# Patient Record
Sex: Male | Born: 1945 | Race: Black or African American | Hispanic: No | Smoking: Current every day smoker
Health system: Southern US, Community
[De-identification: ages and names within clinical notes are randomized; demographics above are authoritative.]

## PROBLEM LIST (undated history)

## (undated) DIAGNOSIS — F0390 Unspecified dementia without behavioral disturbance: Secondary | ICD-10-CM

## (undated) DIAGNOSIS — B1920 Unspecified viral hepatitis C without hepatic coma: Secondary | ICD-10-CM

## (undated) DIAGNOSIS — C259 Malignant neoplasm of pancreas, unspecified: Secondary | ICD-10-CM

## (undated) DIAGNOSIS — I10 Essential (primary) hypertension: Secondary | ICD-10-CM

---

## 2001-01-06 ENCOUNTER — Emergency Department (HOSPITAL_COMMUNITY): Admission: EM | Admit: 2001-01-06 | Discharge: 2001-01-06 | Payer: Self-pay | Admitting: Emergency Medicine

## 2001-05-24 ENCOUNTER — Encounter: Payer: Self-pay | Admitting: Emergency Medicine

## 2001-05-24 ENCOUNTER — Inpatient Hospital Stay (HOSPITAL_COMMUNITY): Admission: EM | Admit: 2001-05-24 | Discharge: 2001-05-25 | Payer: Self-pay | Admitting: Emergency Medicine

## 2003-10-13 ENCOUNTER — Emergency Department (HOSPITAL_COMMUNITY): Admission: EM | Admit: 2003-10-13 | Discharge: 2003-10-13 | Payer: Self-pay | Admitting: Emergency Medicine

## 2004-06-25 ENCOUNTER — Emergency Department (HOSPITAL_COMMUNITY): Admission: EM | Admit: 2004-06-25 | Discharge: 2004-06-25 | Payer: Self-pay | Admitting: Emergency Medicine

## 2004-07-01 ENCOUNTER — Emergency Department (HOSPITAL_COMMUNITY): Admission: EM | Admit: 2004-07-01 | Discharge: 2004-07-01 | Payer: Self-pay | Admitting: Family Medicine

## 2005-01-09 ENCOUNTER — Encounter: Admission: RE | Admit: 2005-01-09 | Discharge: 2005-01-09 | Payer: Self-pay | Admitting: Occupational Medicine

## 2005-01-16 ENCOUNTER — Encounter: Admission: RE | Admit: 2005-01-16 | Discharge: 2005-01-16 | Payer: Self-pay | Admitting: Internal Medicine

## 2008-10-08 ENCOUNTER — Emergency Department (HOSPITAL_BASED_OUTPATIENT_CLINIC_OR_DEPARTMENT_OTHER): Admission: EM | Admit: 2008-10-08 | Discharge: 2008-10-08 | Payer: Self-pay | Admitting: Emergency Medicine

## 2009-05-08 ENCOUNTER — Emergency Department (HOSPITAL_COMMUNITY): Admission: EM | Admit: 2009-05-08 | Discharge: 2009-05-08 | Payer: Self-pay | Admitting: Emergency Medicine

## 2009-05-10 ENCOUNTER — Emergency Department (HOSPITAL_BASED_OUTPATIENT_CLINIC_OR_DEPARTMENT_OTHER): Admission: EM | Admit: 2009-05-10 | Discharge: 2009-05-10 | Payer: Self-pay | Admitting: Emergency Medicine

## 2009-05-14 ENCOUNTER — Emergency Department (HOSPITAL_COMMUNITY): Admission: EM | Admit: 2009-05-14 | Discharge: 2009-05-14 | Payer: Self-pay | Admitting: Emergency Medicine

## 2009-05-14 ENCOUNTER — Ambulatory Visit: Payer: Self-pay | Admitting: Diagnostic Radiology

## 2009-05-14 ENCOUNTER — Emergency Department (HOSPITAL_BASED_OUTPATIENT_CLINIC_OR_DEPARTMENT_OTHER): Admission: EM | Admit: 2009-05-14 | Discharge: 2009-05-14 | Payer: Self-pay | Admitting: Emergency Medicine

## 2011-03-25 LAB — DIFFERENTIAL
Basophils Relative: 1 % (ref 0–1)
Basophils Relative: 1 % (ref 0–1)
Eosinophils Absolute: 0.2 10*3/uL (ref 0.0–0.7)
Lymphocytes Relative: 38 % (ref 12–46)
Monocytes Absolute: 0.4 10*3/uL (ref 0.1–1.0)
Monocytes Relative: 10 % (ref 3–12)
Neutro Abs: 2.1 10*3/uL (ref 1.7–7.7)
Neutrophils Relative %: 37 % — ABNORMAL LOW (ref 43–77)

## 2011-03-25 LAB — RAPID URINE DRUG SCREEN, HOSP PERFORMED
Barbiturates: NOT DETECTED
Opiates: NOT DETECTED
Tetrahydrocannabinol: NOT DETECTED

## 2011-03-25 LAB — BASIC METABOLIC PANEL
BUN: 15 mg/dL (ref 6–23)
CO2: 28 mEq/L (ref 19–32)
CO2: 31 mEq/L (ref 19–32)
Calcium: 8.6 mg/dL (ref 8.4–10.5)
Chloride: 109 mEq/L (ref 96–112)
Chloride: 107 mEq/L (ref 96–112)
Creatinine, Ser: 1 mg/dL (ref 0.4–1.5)
GFR calc Af Amer: >60 mL/min (ref >60)
Potassium: 3.7 mEq/L (ref 3.5–5.1)
Sodium: 142 mEq/L (ref 135–145)

## 2011-03-25 LAB — POCT I-STAT, CHEM 8
BUN: 14 mg/dL (ref 6–23)
Calcium, Ion: 1.14 mmol/L (ref 1.12–1.32)
Chloride: 109 mEq/L (ref 96–112)
Glucose, Bld: 85 mg/dL (ref 70–99)

## 2011-03-25 LAB — CBC
HCT: 38.9 % — ABNORMAL LOW (ref 39.0–52.0)
Hemoglobin: 13.1 g/dL (ref 13.0–17.0)
MCHC: 33.7 g/dL (ref 30.0–36.0)
MCHC: 33.5 g/dL (ref 30.0–36.0)
MCV: 91.2 fL (ref 78.0–100.0)
Platelets: 184 10*3/uL (ref 150–400)
RBC: 4.3 MIL/uL (ref 4.22–5.81)

## 2011-05-02 NOTE — Discharge Summary (Signed)
[place]. [facility]  Patient:    Joseph Robbins, Joseph Robbins                        MRN: [id number] Adm. Date:  [id number] Disc. Date: [id number] Attending:  Anastasio Auerbach CC:         Kern Reap, M.D.   Discharge Summary  DATE OF BIRTH:  1946-06-28.  DISCHARGE DIAGNOSES:  1. Chest pain, etiology unclear.     a. Essentially normal cardiac catheterization on May 25, 2001.     b. Questionable gastroesophageal reflux disease.     c. 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     d. Suggested outpatient gastrointestinal evaluation.  2. Uncontrolled hypertension, history of noncompliance with medical regimen.  3. Chronic lung disease with severe chronic obstructive pulmonary disease,     evidenced by computerized tomography scan.  4. Interstitial lung disease of unknown etiology, outpatient follow-up     recommended.  5. Acute bronchitis:  Complete course of antibiotic therapy.  6. Questionable cardiomegaly by chest x-ray and computerized tomography of     the chest:  No evidence of such on cardiac catheterization with normal     ejection fraction on left ventriculogram.  7. Mildly elevated transaminases, questionable history of previous outpatient     workup, etiology unknown.  8. Tobacco abuse:  Motivated to quit and to use nicotine patches.  9. Normocytic anemia:  Outpatient workup recommended. 10. Allergy to penicillin.  DISCHARGE MEDICATIONS: 1. Lasix 20 mg q.d. 2. Enteric-coated aspirin 325 mg q.d. 3. Humibid LA 600 mg 2 tablets b.i.d. p.r.n. 4. Protonix 40 mg 1 p.o. q.d. 5. Lactulose 15 to 30 mL q.d. p.r.n. constipation. 6. Albuterol inhaler 2 puffs q.i.d. p.r.n. 7. Atrovent inhaler 2 puffs q.i.d. p.r.n. 8. Doxycycline 100 mg 1 tablet b.i.d. for 6 days and then stop.  CONSULTANTS:  [facility].  PROCEDURES: 1. Left sided coronary artery catheterization on May 25, 2001 revealing    normal wall motion with ejection fraction  65-70%.  No significant disease    in the left main with no significant disease in the left anterior    descending coronary artery and an essentially clear nondominant right    coronary artery. 2. CT scan of the chest on May 24, 2001 with spiral CT protocol.  No evidence    of pulmonary embolus.  Small bowel effusions, left greater than right.    Cardiomegaly, significant scarring/early fibrosis with moderate    emphysematous change throughout.  FOLLOW-UP:  The patient wishes to initiate a relationship with a new primary care physician here in [place].  I have facilitated his follow-up with Dr. Kern Reap at [facility] on Monday, June 07, 2001 at 10:15 a.m.  The patient is instructed that it is very important for him to follow up with Dr. Kern Reap.  HISTORY OF PRESENT ILLNESS:  Joseph Robbins is a very pleasant 65 year old African-American male who presents to the hospital on the date of admission with complaints of worsening chronic chest pain.  The patient describes the pain as intermittent, pressure-like, and nonexertional.  The patient stated that he had the pain for many months.  He stated that his pain was normally relieved with Motrin or simply with standing up.  However, this episode was started approximately two days prior to admission, did not relent.  The patients pain was constant, lasting the entire 24 hours prior to his admission.  The patient felt that  his pain was likely due to gas with severe constipation. Because of significant concern for possible coronary artery disease, the patient was admitted to the telemetry unit for ongoing care.  HOSPITAL COURSE: #1 - CHEST PAIN:  The patient was initially treated with IV nitrates, heparin, and cardiology consultation was obtained.  Cardiac enzymes were obtained and elevated CK was appreciated with normal troponin Is.  There was no significant acute change noted in the EKG.  Under cardiology  recommendations and consent of the patient, the patient was taken for a left heart catheterization for risk stratification on May 25, 2001.  As noted above, there was no evidence of significant coronary artery disease and therefore the feeling was the patients episode did not likely represent true angina.  The etiology of the patients elevated CK was unclear but it was felt that simple outpatient follow-up would be adequate given the patients normal catheterization.  There was no evidence of congestive heart failure on left ventriculogram as noted above.  In outpatient follow-up, it is recommended that further investigation be given to possible GI etiology of the patients pain.  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  The patient indeed reported that he had not had a bowel movement in four days prior to his admission.  He is being discharged on lactulose for treatment of this issue.  Given the concomitant findings of normocytic anemia, it would be prudent, however, to obtain outpatient GI evaluation for possible EGD to rule out gastritis or peptic ulcer and routine screening colonoscopy.  #2 - UNCONTROLLED HYPERTENSION:  Treated this hospitalization.  The patient was noted to have blood pressures elevated in the 180 to 190 range.  The patient admitted that he had been diagnosed for hypertension in the past but has been noncompliant with his medications.  The patient was started on Lasix during hospitalization and tolerated this medication without difficulty. Blood pressure was well controlled during the hospitalization with this medication.  He is being discharged on Lasix and has been counselled extensively as to the need for strict compliance with this medication and ongoing outpatient follow-up.  It is recommended that basic metabolic panel be obtained in the patients  follow-up to assure the potassium has not been seriously effected by  Lasix and that routine screening of blood pressure be accomplished.  #3 - CHRONIC LUNG DISEASE WITH ACUTE BRONCHITIS:  The patient was admitted and did have reports of intermittent sputum production.  A CT scan revealed moderate bolus-type change with some interstitial fibrosis.  It was felt that this likely could represent simple emphysema from longstanding tobacco abuse. The patient was initiated on albuterol and Atrovent inhalers and did well with those.  Symptomatically, he was controlled with Humibid.  There was felt to be a degree of acute bronchitis.  A course of doxycycline orally was initiated and will be completed on an outpatient basis.  It is recommended that simple follow-up be accomplished and it be considered by his primary care physician that he does have a new diagnosis of probable emphysema.  #4 - QUESTIONABLE CARDIOMYOPATHY:  Initial chest x-ray on admission and CT scan suggested a possible element of cardiomyopathy.  Ejection fraction was noted to be normal during hospitalization via catheterization.  Potential etiologies of cardiomyopathy would be the patients previous history of IV drug abuse and alcohol use.  Possibility of an outpatient echocardiogram should be considered by a  primary care physician to assure there is no element of a diastolic dysfunction which may result from hypertension.  #5 - MILDLY ELEVATED TRANSAMINASE:  At the time of admission, the patients transaminases were noted to be mildly elevated with an SGOT of 74 and SGPT of 81 with alkaline phosphatase of 112 and total bilirubin of 0.7.  The patient reported per his history that this had been worked up in the past and was felt to be related to chronic methadone use.  On second day of hospitalization, this was trending down with SGOT of 63 and SGPT of 70.  The patient was asymptomatic and it was simply recommended this be  followed up as an outpatient.  #6 - TOBACCO ABUSE:  The patient is tolerating a nicotine patch in the hospital without difficulty.  He was advised that he could obtain this therapy over the counter and agreed to do so.  He was counselled during his hospitalization as to the absolute requirement that he discontinue smoking for future health.  #7 - NORMOCYTIC ANEMIA:  During hospitalization, the patients hemoglobin was noted in the range between 12 and 12.4 with a normocytic MCV.  There was no evidence of GI blood loss.  It is recommended, however, the patient undergo full evaluation of his normocytic anemia in the outpatient setting to include iron panel, ferratin, and a possible GI workup.  CONDITION ON DISCHARGE:  At the time of discharge, cardiology service and the medicine service were in agreement that the patient was steady and clear for discharge.  The patient requested to be discharge and was very anxious to leave the hospital.  He was deemed medically stable and discharge was arranged.  The patient will follow up with Dr. Kern Reap on the noted date above.  DISPOSITION:  At the time of follow-up multiple issues will need to be addressed as detailed in the body of this dictation. DD:  05/25/01 TD:  05/25/01 Job: [id number]

## 2011-05-02 NOTE — Cardiovascular Report (Signed)
[place]. [facility]  Patient:    Joseph Robbins, Joseph Robbins                        MRN: [id number] Proc. Date: 05/25/01 Adm. Date:  [id number] Attending:  Miguel Aschoff CC:         Dr. Shary Decamp   Cardiac Catheterization  INDICATIONS FOR PROCEDURE:  Chest pain with positive cardiac enzymes.  DESCRIPTION OF PROCEDURE:  After obtaining written informed consent, the patient was brought to the cardiac catheterization lab in the postabsorptive state.  Preoperative sedation was provided with the patients usual methadone dose.  The right groin was prepped and draped in the usual sterile fashion. Local anesthesia was achieved using 1% Xylocaine.  A 6 French hemostasis sheath was placed into the right femoral artery using a modified Seldinger technique.  Selective coronary angiography was performed using a JL4, JR4 Judkins catheter.  A single plane ventriculogram was performed in the RAO position using a 6 French straight pigtail curved catheter.  Multiple views were obtained.  All catheter exchange were made over a guide wire.  FINDINGS:  The aorta pressure is 188/83, LV pressure is 182/14.  There was no gradient noted on pullback.  Single plane ventriculogram revealed normal wall motion with an ejection fraction of 65-70%.  There was no mitral regurgitation noted.  CORONARY ANGIOGRAPHY:  Fluoroscopy revealed no calcification.  Left main coronary artery:  The left main coronary artery bifurcated into the left anterior descending and in circumflex vessel. There was no significant disease in the left main coronary artery.  Left anterior descending:  The left anterior descending gave rise to a large moderate sized diagonal #1, small diagonal #2 and went on to end as an apical recurrent branch.  The second diagonal which was a small branch had a 60-70% ostial lesion.  Circumflex vessel:  The circumflex vessel was large and dominant for the posterior circulation and gave  rise to a moderate to large OM-1, small OM-2, small OM-3, and went on to end as a PDA and PL branch.  There was no significant disease in the circumflex or its branches.  Right coronary artery:  The right coronary artery was a small nondominant artery, gave rise to three RV marginals before it truncated.  IMPRESSION: 1. Noncritical disease and a small second diagonal. 2. Normal left ventricular function.  Ejection fraction of 65-70%.  RECOMMENDATIONS:  Antihypertensive control and to consider other etiologies of his chest pain. DD:  05/25/01 TD:  05/25/01 Job: [id number]

## 2011-05-02 NOTE — Consult Note (Signed)
[place]  Patient:    Joseph Robbins, Joseph Robbins                        MRN: [id number] Adm. Date:  [id number] Attending:  Miguel Aschoff                          Consultation Report  REFERRING:  Dr. Feliciana Rossetti, [place], [place]  PROBLEM LIST: 1. Chest pain with positive cardiac enzymes.  Negative pulmonary embolus and    deep venous thrombosis by CT scan. 2. Uncontrolled hypertension. 3. Chronic lung disease secondary to heavy smoking for more than 40 years. 4. Cardiomegaly with small pleural effusion. 5. Abnormal LFTs. 6. Polysubstance abuse.  History of intravenous drug abuse with heroin, quit    in November 2001, currently on methadone. 7. History of gastroesophageal reflux disease.  HISTORY OF PRESENT ILLNESS:  Joseph Robbins is a pleasant 65 year old African-American male who presented with complaints of right chronic chest pain.  The chest pain has been present for many months but became significantly more severe on the morning of arrival.  He states that his pain usually is relieved with Motrin or standing up.  This time, his pain started on Sunday morning, approximately one day prior to presentation.  The pain was described as persistent.  He initially attributed the chest pain to gas. There had been no palpitations, no diaphoresis, no nausea, no vomiting, and no significant shortness of breath.  CT scan had been performed in the emergency room and was negative for pulmonary embolus.  REVIEW OF SYSTEMS:  The patient has had a yellow productive cough, questionable subjective chills.  No orthopnea, no palpitations, no tachyarrhythmia, no dyspnea.  He has had significant gastritis-type symptoms, presyncope but no syncope.  Approximately three days prior to presentation, the patients wife describes an unusual behavior in which the patient ended up rolling out of bed and striking his forehead.  PAST MEDICAL HISTORY:  As per problem  list.  ALLERGIES:  PENICILLIN.  CURRENT MEDICATIONS:  Methadone 7.5 mg p.o. q.d.  SOCIAL HISTORY:  The patient is separated.  He has four children.  He currently has a live-in girlfriend.  He continues to smoke.  He drinks 2-3 beers per day.  He works for a [employer].  He has used IV drugs in the past, currently is on methadone.  FAMILY HISTORY:  Negative for diabetes, hypertension, strokes, and coronary artery disease.  PHYSICAL EXAMINATION:  GENERAL:  A middle-aged African-American male appearing older than his stated age.  VITAL SIGNS:  Blood pressure 164/68, heart rate 48-50.  O2 saturation was 92% on room air.  He was afebrile.  HEENT:  Unremarkable.  NECK:  Good carotid upstrokes.  No carotid bruits.  Thyroid is nonpalpable. No evidence of xanthelasma.  PULMONARY:  Breath sounds which are diminished.  No accessory muscles are in use.  Mild basilar crackles are noted.  CARDIAC:  Bradycardic rhythm.  Normal S1, normal S2.  No rubs, murmurs, or gallops noted.  I did not appreciate an S3 or an S4.  ABDOMEN:  Soft, benign, and nontender.  No unusual bruits or pulsations are present.  EXTREMITIES:  No clubbing, cyanosis, or edema.  Distal pulses are equal and palpable.  NEUROLOGIC:  Nonfocal.  Motor is 5/5 throughout.  LABORATORY DATA:  CT scan of the chest is consistent with moderate to severe degree of emphysema.  Small pleural effusion at  the base, worse on the left than the right.  ECG reveals normal sinus rhythm with left axis deviation. Nonspecific ST changes are noted in aVL and I.  Troponin-I is 0.02.  Initial CK is 1148 with a CK-MB of 29.  Lipase is 17. Sodium 139, potassium 3.9, BUN 12, creatinine 0.7.  Hemoglobin 12.9, white blood count 4.3, platelet count 206,000.  IMPRESSION: 1. Chest pain with positive CK-MB.  The chest pain is atypical in nature;    however, secondary to a positive MB fraction we will proceed with coronary    angiography.   Continue with aspirin, nitrates, and heparin.  As noted    above, CT scan demonstrates no pulmonary embolus. 2. Uncontrolled hypertension, would consider initiating Cardizem CD for    antihypertensive control.  The patient has previously been noncompliant    with his antihypertensive regimen. 3. Abnormal liver function tests may be most likely secondary to his alcohol    use versus methadone. 4. Normocytic anemia.  Gabriel denies bright red blood per rectum.  He has had no    black, tarry stools. 5. Polysubstance drug abuse, should discuss smoking cessation and alcohol    discontinuation.  Further recommendations pending the outcome of the coronary angiography. DD:  05/25/01 TD:  05/25/01 Job: [id number]

## 2011-05-02 NOTE — H&P (Signed)
[place]  Patient:    Joseph Robbins, Joseph Robbins                        MRN: [id number] Adm. Date:  [id number] Attending:  Armanda Heritage CC:         Dr. Shary Decamp; Rosalita Levan   History and Physical  DATE OF BIRTH:  01-18-1946  PROBLEM LIST:  1. Chest pain, rule out myocardial infarction associated with abnormal EKG.     a. Negative pulmonary embolus and deep venous thrombosis by CT scan        protocol.  2. Uncontrolled hypertension.  3. Chronic lung disease secondary to heavy smoking and intravenous drug     abuse.     a. Smoking one pack per day x 40 years.     b. Emphysema.     c. Idiopathic pulmonary fibrosis.  4. Cardiomegaly associated with small pleural effusions.     a. Rule out cardiomyopathy.  5. Abnormal LFTs.     a. Alcohol effect versus passive congestion.  6. Polysubstance abuse.     a. 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     b. Smoking as above.     c. Alcohol abuse two to three beers a day.  7. Rule out acute bronchitis.  8. Rule out gastroesophageal reflux disease.  9. Normocytic anemia.  Hemoglobin 12.9, MCV 88. 10. History of left knee contusion.  Emergency room evaluation in January     2002. 11. Allergies to PENICILLIN (hives).  CHIEF COMPLAINT:  Chest pain.  HISTORY OF PRESENT ILLNESS:  Joseph Robbins is a very pleasant 65 year old African-American male who presents with a worsening problem of chronic chest pain.  The patient describes a chronic history of chest pain that is intermittent, pressure like, non-exertional for many months.  He states that normally his pain used to be relieved with Motrin or standing up.  This time his pain started on Sunday morning (about 24 hours ago).  It was constant and lasted the whole day.  He woke up this morning with same symptoms.  He feels this pain is due to gas.  The pain is mostly located in the mid and right side of the chest  and it goes across the chest into the back.  No palpitations.  No diaphoresis.  No nausea or vomiting.  The patient describes his chest pain to last for hours.  No recent trips.  No lower extremity weakness or swelling. No previous history of heart problems, though Joseph Robbins has had a poor medical followup in the past due to compliance issues.  He quit doing intravenous heroin about seven months ago.  He is currently on methadone as provided by [facility].  The patient also describes some yellowish productive cough.  Questionable subjective chills are described too. No orthopnea or PND.  No postnasal drip.  No rhinorrhea.  No fever.  No diarrhea, constipation.  No urinary symptoms.  No dyspnea with exertion.  The patient describes some dyspepsia and GERD like symptoms in the past.  His last drink was yesterday.  No headache.  No focal weakness or swallowing problems. The patient describes slight lightheadedness with presyncope, though no full syncope.  No seizure activity.  PAST MEDICAL HISTORY:  As per problem list.  ALLERGIES:  As per problem list.  MEDICATIONS:  Methadone 7.5 mg q.d.  SOCIAL HISTORY:  The patient is separated.  He has four children.  Currently he has a girlfriend that he lives with.  He smokes as described in problem list.  He drinks about two to three beers per day.  He works for a [employer].  He used to use IV drugs.  He started using IV heroin back in 1965 and he quit in 1970s and resumed this addiction in 1990s.  His last heroin use was in November 2001.  FAMILY HISTORY:  Negative for diabetes, hypertension, strokes, early coronary artery disease, malignancy.  REVIEW OF SYSTEMS:  As per HPI.  No skin rash.  No headaches.  No visual changes.  No vertigo.  PHYSICAL EXAMINATION  VITAL SIGNS:  Temperature 97.1, blood pressure 154/68, respirations 24, heart rate 49, oxygen saturation 92% on room air.  HEENT:  Normocephalic,  atraumatic.  Non-icteric sclerae.  Conjunctivae within normal limits.  PERRLA.  EOMI.  Funduscopic examination negative for papilledema or hemorrhage.  TMs within normal limits.  Moist mucous membranes.  Oropharynx:  Clear.  NECK:  Supple.  No JVD.  No bruits.  No adenopathy.  No thyromegaly.  LUNGS:  Mild basilar rales and crackles.  No wheezing.  Poor to fair air movement bilaterally.  CARDIAC:  Bradycardic.  A 1/6 systolic ejection murmur at the base.  No S3. Possible S4.  No rubs.  ABDOMEN:  Flat, nontender, nondistended.  Bowel sounds were present.  No hepatosplenomegaly.  No rebound.  No guarding.  No masses.  GENITOURINARY:  Within normal limits.  RECTAL:  Negative.  EXTREMITIES:  No edema, clubbing, cyanosis.  Pulses 1+ bilaterally.  NEUROLOGIC:  Alert, oriented x 3.  Cranial nerves 2-12 intact.  Sensorium intact.  Strength 5/5 in all extremities.  DTRs 3/5 in all extremities. Plantar reflexes downgoing bilaterally.  LABORATORIES:  CT scan of the chest consistent with moderate to severe degree of emphysema changes with interstitial fibrosis.  There is a small pleural effusion at the bases, worse on the left than the right.  There is also atelectasis at the bases.  No definitive infiltrate nor pulmonary edema.  EKG: Normal sinus rhythm with left axis deviation.  There is known specific T-wave changes in lead aVL and 1.  There is poor R-wave progression and questionable slight ST segment depression in inferior leads.  Troponin I 0.02, CK 1148, CK-MB 29.  Lipase 17.  Sodium 139, potassium 3.9, chloride 110, CO2 24, BUN 12, creatinine 0.7, glucose 103, SGOT 74, SGPT 81, alkaline phosphatase 112, total bilirubin 0.7.  Hemoglobin 12.9, MCV 88, WBC 4.3, platelets 206,000.  ASSESSMENT AND PLAN: 1. Chest pain, rule out myocardial infarction.  The patient presents with    atypical chest pain with features that may resemble chronic lung disease.    Currently the patient is  chest pain free.  The EKG and cardiac enzymes are     non-acute.  Given the patients risk factors, he will be admitted to a    telemetry bed.  Cardiac enzymes series and EKGs will be obtained.  Tomorrow    morning a Cardiolite will be performed.  In the meantime, aspirin,    nitrates, and heparin will be used.  A beta blocker will not be used due to    the patients bradycardia and severe lung disease.  Problems as described    previously, a chest CT scan showed no pulmonary embolus, severe lung    disease in the degree of emphysema, and interstitial fibrosis was noticed.  Other etiologies to explain the patients chest symptoms could be    gastroesophageal reflux disease associated with esophageal spasms. 2. Uncontrolled hypertension.  As described previously, the patient did not    take the antihypertensive agents prescribed by his primary care physician.    Currently, the patients blood pressure is in the mild range of    hypertension.  For now will use a low dose diuretic agent along with a    nitrate that will be used for heart protection.  If the ejection fraction    were to be decreased, an ACE inhibitor also could be considered. 3. Chronic lung disease, rule out acute bronchitis.  As mentioned before, the    CAT scan of the chest showed severe emphysema associated with pulmonary    fibrosis.  The patient describes some yellowish sputum, productive cough    along with chronic smoking.  Chronic bronchitis could be possible.  For now    will use supportive care along with doxycycline and nebulizer treatments.    Humibid will be also used.  Another chest x-ray will be obtained in the    morning. 4. Cardiomegaly, probable cardiomyopathy.  The patients nontreated    hypertension and history of intravenous drug abuse are the most likely    etiologies for the possible cardiomegaly.  We suspect cardiomyopathy due to    these etiologies.  A Cardiolite tomorrow morning also will be  evaluating    the patients ejection fraction along with ruling out significant coronary    artery disease.  On the heart examination no significant murmurs were    noticed.  If idiopathic cardiomyopathy is identified by the Cardiolite, a    2-D echocardiogram could be considered as an outpatient to evaluate the    patients right-sided heart pressures. 5. Abnormal liver function tests.  Mild elevations of the liver function tests    are noted.  Alcohol use versus methadone versus positive congestion are the    most likely reasons for these abnormal liver function tests.  There is no    need to obtain hepatitis serologies at this point, though screening for    hepatitis B and C should be done by the primary care Ronnel Zuercher upon    discharge given the patients history of intravenous drug abuse.  A new set    of liver function tests will be evaluated tomorrow morning and further    workup will be decided by Dr. Sheppard Penton. 6. Normocytic anemia.  There is no evidence of gastrointestinal bleed.  The    patient denies NSAID use as an outpatient.  A new hemoglobin will be    obtained in the morning.  A gastrointestinal evaluation will be recommended    as an outpatient given the patients history of dyspepsia.  Again, a new    hemoglobin will be checked tomorrow morning. 7. Smoking.  We talked about smoking cessation for about 10 minutes.  The    patient is agreeable to start nicotine patch.  Will continue encouraging    smoking cessation. 8. Alcohol use/abuse.  I also had a long discussion about rehabilitation and    AA meetings.  For now will use multivitamins, thiamine, and will prescribe    ______ p.r.n. for withdrawal if needed. 9. Questionable gastroesophageal reflux disease.  As above, an outpatient    gastrointestinal workup should be performed.  For now we will start protime    pump inhibitors and follow clinically. DD:  05/24/01 TD:  05/24/01 Job:  [id number]

## 2011-09-16 LAB — URINE MICROSCOPIC-ADD ON

## 2011-09-16 LAB — COMPREHENSIVE METABOLIC PANEL
Albumin: 3.7
Alkaline Phosphatase: 91
BUN: 17
Calcium: 9.4
Potassium: 4.5
Sodium: 143
Total Protein: 8.1

## 2011-09-16 LAB — DIFFERENTIAL
Basophils Relative: 1
Lymphs Abs: 1.9
Monocytes Absolute: 0.4
Monocytes Relative: 9
Neutro Abs: 1.9

## 2011-09-16 LAB — URINALYSIS, ROUTINE W REFLEX MICROSCOPIC
Glucose, UA: NEGATIVE
Specific Gravity, Urine: 1.023
pH: 6

## 2011-09-16 LAB — ETHANOL: Alcohol, Ethyl (B): <5

## 2011-09-16 LAB — POCT CARDIAC MARKERS
CKMB, poc: 6.6
Troponin i, poc: <0.05

## 2011-09-16 LAB — ACETAMINOPHEN LEVEL: Acetaminophen (Tylenol), Serum: <10 — ABNORMAL LOW

## 2011-09-16 LAB — CBC
HCT: 43.4
Platelets: 192
RDW: 13.8

## 2011-09-16 LAB — POCT TOXICOLOGY PANEL

## 2011-12-11 ENCOUNTER — Encounter (HOSPITAL_COMMUNITY): Payer: Self-pay | Admitting: *Deleted

## 2011-12-11 ENCOUNTER — Observation Stay (HOSPITAL_COMMUNITY)
Admission: EM | Admit: 2011-12-11 | Discharge: 2011-12-15 | Disposition: A | Discharge status: Home / Self Care | Payer: Medicare Other | Attending: Internal Medicine | Admitting: Internal Medicine

## 2011-12-11 ENCOUNTER — Emergency Department (INDEPENDENT_AMBULATORY_CARE_PROVIDER_SITE_OTHER)
Admission: EM | Admit: 2011-12-11 | Discharge: 2011-12-11 | Disposition: A | Discharge status: Home / Self Care | Payer: Medicare Other | Source: Home / Self Care

## 2011-12-11 ENCOUNTER — Encounter: Payer: Self-pay | Admitting: Emergency Medicine

## 2011-12-11 ENCOUNTER — Other Ambulatory Visit: Payer: Self-pay

## 2011-12-11 ENCOUNTER — Emergency Department (INDEPENDENT_AMBULATORY_CARE_PROVIDER_SITE_OTHER): Payer: Medicare Other

## 2011-12-11 ENCOUNTER — Emergency Department (HOSPITAL_COMMUNITY): Payer: Medicare Other

## 2011-12-11 DIAGNOSIS — F1411 Cocaine abuse, in remission: Secondary | ICD-10-CM | POA: Insufficient documentation

## 2011-12-11 DIAGNOSIS — Z9119 Patient's noncompliance with other medical treatment and regimen: Secondary | ICD-10-CM | POA: Insufficient documentation

## 2011-12-11 DIAGNOSIS — A5900 Urogenital trichomoniasis, unspecified: Secondary | ICD-10-CM | POA: Insufficient documentation

## 2011-12-11 DIAGNOSIS — I160 Hypertensive urgency: Secondary | ICD-10-CM

## 2011-12-11 DIAGNOSIS — F1910 Other psychoactive substance abuse, uncomplicated: Secondary | ICD-10-CM | POA: Diagnosis present

## 2011-12-11 DIAGNOSIS — Z23 Encounter for immunization: Secondary | ICD-10-CM | POA: Insufficient documentation

## 2011-12-11 DIAGNOSIS — Z91199 Patient's noncompliance with other medical treatment and regimen due to unspecified reason: Secondary | ICD-10-CM | POA: Insufficient documentation

## 2011-12-11 DIAGNOSIS — F17200 Nicotine dependence, unspecified, uncomplicated: Secondary | ICD-10-CM | POA: Insufficient documentation

## 2011-12-11 DIAGNOSIS — H538 Other visual disturbances: Secondary | ICD-10-CM | POA: Insufficient documentation

## 2011-12-11 DIAGNOSIS — I10 Essential (primary) hypertension: Secondary | ICD-10-CM

## 2011-12-11 DIAGNOSIS — R05 Cough: Secondary | ICD-10-CM

## 2011-12-11 DIAGNOSIS — IMO0001 Reserved for inherently not codable concepts without codable children: Secondary | ICD-10-CM | POA: Diagnosis present

## 2011-12-11 DIAGNOSIS — F1010 Alcohol abuse, uncomplicated: Secondary | ICD-10-CM | POA: Insufficient documentation

## 2011-12-11 DIAGNOSIS — B1920 Unspecified viral hepatitis C without hepatic coma: Secondary | ICD-10-CM | POA: Insufficient documentation

## 2011-12-11 DIAGNOSIS — F1020 Alcohol dependence, uncomplicated: Secondary | ICD-10-CM | POA: Diagnosis present

## 2011-12-11 HISTORY — DX: Unspecified viral hepatitis C without hepatic coma: B19.20

## 2011-12-11 HISTORY — DX: Essential (primary) hypertension: I10

## 2011-12-11 LAB — URINALYSIS, ROUTINE W REFLEX MICROSCOPIC
Bilirubin Urine: NEGATIVE
Glucose, UA: NEGATIVE mg/dL
Protein, ur: 100 mg/dL — AB
Specific Gravity, Urine: 1.011 (ref 1.005–1.030)

## 2011-12-11 LAB — COMPREHENSIVE METABOLIC PANEL
ALT: 25 U/L (ref 0–53)
AST: 45 U/L — ABNORMAL HIGH (ref 0–37)
Albumin: 2.7 g/dL — ABNORMAL LOW (ref 3.5–5.2)
CO2: 27 mEq/L (ref 19–32)
Chloride: 102 mEq/L (ref 96–112)
Creatinine, Ser: 1.31 mg/dL (ref 0.50–1.35)
GFR calc non Af Amer: 55 mL/min — ABNORMAL LOW (ref >90)
Sodium: 139 mEq/L (ref 135–145)
Total Bilirubin: 0.5 mg/dL (ref 0.3–1.2)

## 2011-12-11 LAB — RAPID URINE DRUG SCREEN, HOSP PERFORMED
Amphetamines: NOT DETECTED
Barbiturates: NOT DETECTED
Cocaine: NOT DETECTED
Opiates: NOT DETECTED
Tetrahydrocannabinol: NOT DETECTED

## 2011-12-11 LAB — CBC
MCV: 91 fL (ref 78.0–100.0)
Platelets: 155 10*3/uL (ref 150–400)
RBC: 4.33 MIL/uL (ref 4.22–5.81)
RDW: 13.8 % (ref 11.5–15.5)
WBC: 4.2 10*3/uL (ref 4.0–10.5)

## 2011-12-11 MED ORDER — HYDRALAZINE HCL 20 MG/ML IJ SOLN
10.0000 mg | INTRAMUSCULAR | Status: DC | PRN
  Administered 2011-12-11 – 2011-12-12 (×2): 10 mg via INTRAVENOUS
  Filled 2011-12-11 (×3): qty 0.5

## 2011-12-11 MED ORDER — HYDRALAZINE HCL 20 MG/ML IJ SOLN
10.0000 mg | Freq: Once | INTRAMUSCULAR | Status: DC
  Filled 2011-12-11 (×2): qty 0.5

## 2011-12-11 MED ORDER — LISINOPRIL 10 MG PO TABS
10.0000 mg | ORAL_TABLET | Freq: Once | ORAL | Status: AC
  Administered 2011-12-11: 10 mg via ORAL
  Filled 2011-12-11: qty 1

## 2011-12-11 NOTE — ED Provider Notes (Signed)
History     CSN: [id number]  Arrival date & time 12/11/11  1132   None     Chief Complaint  Patient presents with  . URI    (Consider location/radiation/quality/duration/timing/severity/associated sxs/prior treatment) HPI Comments: Pt presents with with c/o cough x 4-6 wks. Cough is productive. He denies dyspnea but has had intermittent wheezing. No nasal congestion, sore throat or fever. He has been taking otc cough medications - Dayquil and Nyquil without improvement. Pt reports a hx of crack use, and states his last use was 3-4 mos ago. He was noted to have elevated BP and admits to hx of HTN. Last took meds approx 3 yrs ago and has not been to a physician for approximately that long as well. No chest pain, HA or dizziness. He occasionally has blurred vision. Pt also states he was told at one time during a hospitalization that he had he had Hepatitis. He believes Hepatitis C but is uncertain.  The history is provided by the patient.    Past Medical History  Diagnosis Date  . Hypertension   . Hepatitis C     History reviewed. No pertinent past surgical history.  History reviewed. No pertinent family history.  History  Substance Use Topics  . Smoking status: Current Everyday Smoker  . Smokeless tobacco: Not on file  . Alcohol Use: Yes      Review of Systems  Constitutional: Negative for fever, chills and fatigue.  HENT: Negative for ear pain, sore throat, rhinorrhea, sneezing, postnasal drip and sinus pressure.   Respiratory: Positive for cough and wheezing. Negative for shortness of breath.   Cardiovascular: Negative for chest pain and palpitations.  Neurological: Negative for dizziness and headaches.    Allergies  Penicillins  Home Medications   Current Outpatient Rx  Name Route Sig Dispense Refill  . NYQUIL PO Oral Take by mouth.      . DAYQUIL PO Oral Take by mouth.        BP 197/106  Pulse 58  Temp(Src) 97.6 F (36.4 C) (Oral)  Resp 24  SpO2  100%  Physical Exam  Nursing note and vitals reviewed. Constitutional: He appears well-developed and well-nourished. No distress.  HENT:  Head: Normocephalic and atraumatic.  Right Ear: Tympanic membrane, external ear and ear canal normal.  Left Ear: Tympanic membrane, external ear and ear canal normal.  Nose: Nose normal.  Mouth/Throat: Uvula is midline, oropharynx is clear and moist and mucous membranes are normal. No oropharyngeal exudate, posterior oropharyngeal edema or posterior oropharyngeal erythema.  Neck: Neck supple.  Cardiovascular: Normal rate, regular rhythm and normal heart sounds.   Pulmonary/Chest: Effort normal. No respiratory distress. He has no decreased breath sounds. He has no wheezes. He has no rhonchi. He has rales in the right lower field and the left lower field.  Lymphadenopathy:    He has no cervical adenopathy.  Neurological: He is alert.  Skin: Skin is warm and dry.  Psychiatric: He has a normal mood and affect.    ED Course  Procedures (including critical care time)  Labs Reviewed - No data to display Dg Chest 2 View  12/11/2011  *RADIOLOGY REPORT*  Clinical Data: Cough for several weeks with smoking history  CHEST - 2 VIEW  Comparison: 01/16/2005  Findings: Heart size and vascular pattern are normal.  There is unchanged moderately heavy interstitial opacification involving the bilateral mid to lower lung zones.  There is some extension into the lateral right upper lobe.  There is no  significant interval change in the appearance when compared to prior study.  IMPRESSION: Chronic pulmonary parenchymal scarring.  No acute findings.  Original Report Authenticated By: [id number]     1. Hypertension   2. Cough       MDM  CXR - chronic changes, no acute findings. Pt transferred to [facility].        Melody Comas, [place] 12/11/11 1540

## 2011-12-11 NOTE — ED Notes (Signed)
Patient reports last crack use 3 months ago per patient and has used needles

## 2011-12-11 NOTE — ED Notes (Addendum)
Patient states that he needs a doctor he can see all the time for his blood pressure and for other concerns he has.  Patient's blood pressure is high today and he has a productive cough and the sputum is clear now but was yellow a week ago. Patient does not appear to be in any distress at this time and stated he just need a doctor he can see all the time.

## 2011-12-11 NOTE — ED Notes (Signed)
Pt sent to ED from Urgent Care ref. Hypertension and abnormal cxr.  Pt st's he use to take meds for hypertension but stopped taking them yrs ago.  Pt st's he was seeing black spots last pm.  Pt also has had productive cough.

## 2011-12-11 NOTE — ED Notes (Signed)
C/o cough, runny nose, denies sore throat, denies ear pain.  Unsure if has had a fever. Denies pain.

## 2011-12-11 NOTE — ED Provider Notes (Signed)
History     CSN: [id number]  Arrival date & time 12/11/11  1520   First MD Initiated Contact with Patient 12/11/11 1635      Chief Complaint  Patient presents with  . Hypertension    (Consider location/radiation/quality/duration/timing/severity/associated sxs/prior treatment) The history is provided by the patient and a relative.   Patient is a 65 year old male with reported history of hypertension who presents with hypertension. He states that he has had hypertension diagnosed in past but has not taken medications for at least a year. For the last couple days he has had 2 intermittent episodes where he had mild fuzziness in his vision.  This involved both eyes.  Both episodes were short-lived and resolved on her own. At time of ED evaluation, patient without symptoms. He did note that is low pressure as high when evaluated by EMS. Patient does state that he has had mild cough the last week or so. He has been taken over-the-counter cough and congestion medications including Sudafed for the symptoms. Patient has history of crack cocaine abuse but has not used in the last 4 months. Patient denies chest pain, dyspnea, confusion, headache, focal weakness, focal sensation changes, difficulty walking, fever.  Overall severity is described as moderate and patient is asymptomatic in the ED. No modifying factors noted. Patient has had similar in past. Patient does not have primary care doctor.  Past Medical History  Diagnosis Date  . Hypertension   . Hepatitis C     History reviewed. No pertinent past surgical history.  History reviewed. No pertinent family history.  History  Substance Use Topics  . Smoking status: Current Everyday Smoker -- 0.5 packs/day for 25 years    Types: Cigarettes  . Smokeless tobacco: Not on file  . Alcohol Use: 3.0 oz/week    5 Cans of beer per week      Review of Systems  Constitutional: Negative for fever, chills and activity change.  HENT: Negative for  congestion and neck pain.   Respiratory: Negative for cough, chest tightness, shortness of breath and wheezing.   Cardiovascular: Negative for chest pain.  Gastrointestinal: Negative for nausea, vomiting, abdominal pain, diarrhea and abdominal distention.  Genitourinary: Negative for difficulty urinating.  Musculoskeletal: Negative for gait problem.  Skin: Negative for rash.  Neurological: Negative for weakness and numbness.  Psychiatric/Behavioral: Negative for behavioral problems and confusion.  All other systems reviewed and are negative.    Allergies  Penicillins  Home Medications   No current outpatient prescriptions on file.  BP 203/87  Pulse 64  Temp(Src) 97.9 F (36.6 C) (Oral)  Resp 21  Ht 6' (1.829 m)  Wt 156 lb 15.5 oz (71.2 kg)  BMI 21.29 kg/m2  SpO2 100%  Physical Exam  Nursing note and vitals reviewed. Constitutional: He is oriented to person, place, and time. He appears well-developed and well-nourished. No distress.  HENT:  Head: Normocephalic.  Nose: Nose normal.       Subjective visual acuity at baseline per patient.  Eyes: EOM are normal. Pupils are equal, round, and reactive to light.  Neck: Normal range of motion. Neck supple. No JVD present.  Cardiovascular: Normal rate, regular rhythm and intact distal pulses.   No murmur heard. Pulmonary/Chest: Effort normal and breath sounds normal. No respiratory distress. He exhibits no tenderness.  Abdominal: Soft. Bowel sounds are normal. He exhibits no distension. There is no tenderness.  Musculoskeletal: Normal range of motion. He exhibits no edema and no tenderness.  No calf ttp  Neurological: He is alert and oriented to person, place, and time. No cranial nerve deficit. Coordination normal.       Normal strength throughout Normal gross sensation throughout No pronator drift Normal finger-nose and heel-to-shin  Skin: Skin is warm and dry. He is not diaphoretic.  Psychiatric: He has a normal mood  and affect. His behavior is normal. Thought content normal.    ED Course  Procedures (including critical care time)   Date: 12/11/2011  Rate: 54  Rhythm: normal sinus rhythm  QRS Axis: normal  Intervals: normal  ST/T Wave abnormalities: nonspecific T wave changes  Conduction Disutrbances:none  Narrative Interpretation:   Old EKG Reviewed: no new ischemic changes    Labs Reviewed  COMPREHENSIVE METABOLIC PANEL - Abnormal; Notable for the following:    Albumin 2.7 (*)    AST 45 (*)    GFR calc non Af Amer 55 (*)    GFR calc Af Amer 63 (*)    All other components within normal limits  URINALYSIS, ROUTINE W REFLEX MICROSCOPIC - Abnormal; Notable for the following:    Hgb urine dipstick MODERATE (*)    Protein, ur 100 (*)    All other components within normal limits  URINE MICROSCOPIC-ADD ON - Abnormal; Notable for the following:    Squamous Epithelial / LPF FEW (*)    All other components within normal limits  CBC  URINE RAPID DRUG SCREEN (HOSP PERFORMED)  MRSA PCR SCREENING  BASIC METABOLIC PANEL  CBC  HIV ANTIBODY (ROUTINE TESTING)   Dg Chest 2 View  12/11/2011  *RADIOLOGY REPORT*  Clinical Data: Hypertension and cough.  History of tobacco use.  CHEST - 2 VIEW  Comparison: 1415 hours and prior chest x-ray dated 01/16/2005.  Findings: Stable severe chronic lung disease with multiple areas of parenchymal scarring noted bilaterally.  No evidence of pneumothorax, edema, focal consolidation, pleural fluid or obvious pulmonary nodule.  Heart size and mediastinal contours are stable.  IMPRESSION: Stable chronic lung disease.  Original Report Authenticated By: Reola Calkins, M.D.   Dg Chest 2 View  12/11/2011  *RADIOLOGY REPORT*  Clinical Data: Cough for several weeks with smoking history  CHEST - 2 VIEW  Comparison: 01/16/2005  Findings: Heart size and vascular pattern are normal.  There is unchanged moderately heavy interstitial opacification involving the bilateral mid to  lower lung zones.  There is some extension into the lateral right upper lobe.  There is no significant interval change in the appearance when compared to prior study.  IMPRESSION: Chronic pulmonary parenchymal scarring.  No acute findings.  Original Report Authenticated By: [id number]     1. Hypertensive urgency       MDM   Clinical picture is consistent with hypertensive urgency. Patient is asymptomatic at time of ED evaluation. He has normal neurologic exam. He also has normal subjective visual acuity on exam. EKG shows LVH which was present in past. Overall labs were unremarkable. Chest x-ray unremarkable. With hypertensive urgency initiated lisinopril. Based on patient's severity of this hypertension, history of noncompliance, and no outpatient followup options; consulted internal medicine. Patient evaluated and admitted by internal medicine. No evidence of endorgan damage or other hypertensive emergency features. Blood pressure control started but gradual since this is likely a chronic issue that is worse after taking numerous over-the-counter cold medications.        Milus Glazier 12/12/11 [id number]

## 2011-12-11 NOTE — ED Notes (Signed)
Pt states he has not been on BP medication for about three years.

## 2011-12-12 ENCOUNTER — Encounter (HOSPITAL_COMMUNITY): Payer: Self-pay | Admitting: General Practice

## 2011-12-12 DIAGNOSIS — I059 Rheumatic mitral valve disease, unspecified: Secondary | ICD-10-CM

## 2011-12-12 LAB — BASIC METABOLIC PANEL
CO2: 29 mEq/L (ref 19–32)
Calcium: 8.6 mg/dL (ref 8.4–10.5)
Creatinine, Ser: 1.39 mg/dL — ABNORMAL HIGH (ref 0.50–1.35)
GFR calc Af Amer: 59 mL/min — ABNORMAL LOW (ref >90)

## 2011-12-12 LAB — CBC
MCV: 90.9 fL (ref 78.0–100.0)
Platelets: 153 10*3/uL (ref 150–400)
RDW: 13.7 % (ref 11.5–15.5)
WBC: 4.8 10*3/uL (ref 4.0–10.5)

## 2011-12-12 LAB — MRSA PCR SCREENING: MRSA by PCR: NEGATIVE

## 2011-12-12 MED ORDER — LORAZEPAM 2 MG/ML IJ SOLN
1.0000 mg | Freq: Four times a day (QID) | INTRAMUSCULAR | Status: AC | PRN
  Administered 2011-12-13: 1 mg via INTRAVENOUS
  Filled 2011-12-12: qty 1

## 2011-12-12 MED ORDER — ACETAMINOPHEN 650 MG RE SUPP: 650.0000 mg | Freq: Four times a day (QID) | RECTAL | Status: DC | PRN

## 2011-12-12 MED ORDER — LISINOPRIL 20 MG PO TABS
20.0000 mg | ORAL_TABLET | Freq: Every day | ORAL | Status: DC
  Administered 2011-12-12 – 2011-12-13 (×2): 20 mg via ORAL
  Filled 2011-12-12 (×2): qty 1

## 2011-12-12 MED ORDER — ONDANSETRON HCL 4 MG/2ML IJ SOLN: 4.0000 mg | Freq: Four times a day (QID) | INTRAMUSCULAR | Status: DC | PRN

## 2011-12-12 MED ORDER — ONDANSETRON HCL 4 MG PO TABS: 4.0000 mg | ORAL_TABLET | Freq: Four times a day (QID) | ORAL | Status: DC | PRN

## 2011-12-12 MED ORDER — LORAZEPAM 1 MG PO TABS: 1.0000 mg | ORAL_TABLET | Freq: Four times a day (QID) | ORAL | Status: AC | PRN

## 2011-12-12 MED ORDER — HYDROCODONE-ACETAMINOPHEN 5-325 MG PO TABS
1.0000 | ORAL_TABLET | ORAL | Status: DC | PRN
  Administered 2011-12-14: 1 via ORAL
  Filled 2011-12-12: qty 1

## 2011-12-12 MED ORDER — VITAMIN B-1 100 MG PO TABS
100.0000 mg | ORAL_TABLET | Freq: Every day | ORAL | Status: DC
  Administered 2011-12-12 – 2011-12-15 (×4): 100 mg via ORAL
  Filled 2011-12-12 (×4): qty 1

## 2011-12-12 MED ORDER — SODIUM CHLORIDE 0.9 % IV SOLN: 250.0000 mL | INTRAVENOUS | Status: DC | PRN

## 2011-12-12 MED ORDER — HYDROCHLOROTHIAZIDE 12.5 MG PO CAPS
12.5000 mg | ORAL_CAPSULE | Freq: Every day | ORAL | Status: DC
  Administered 2011-12-12 – 2011-12-15 (×4): 12.5 mg via ORAL
  Filled 2011-12-12 (×4): qty 1

## 2011-12-12 MED ORDER — LORAZEPAM 0.5 MG PO TABS
0.0000 mg | ORAL_TABLET | Freq: Two times a day (BID) | ORAL | Status: DC
  Administered 2011-12-14: 0.5 mg via ORAL
  Filled 2011-12-12: qty 1

## 2011-12-12 MED ORDER — AMLODIPINE BESYLATE 10 MG PO TABS
10.0000 mg | ORAL_TABLET | Freq: Every day | ORAL | Status: DC
  Administered 2011-12-12 – 2011-12-15 (×4): 10 mg via ORAL
  Filled 2011-12-12 (×4): qty 1

## 2011-12-12 MED ORDER — SODIUM CHLORIDE 0.9 % IJ SOLN: 3.0000 mL | INTRAMUSCULAR | Status: DC | PRN

## 2011-12-12 MED ORDER — ADULT MULTIVITAMIN W/MINERALS CH
1.0000 | ORAL_TABLET | Freq: Every day | ORAL | Status: DC
  Administered 2011-12-12 – 2011-12-15 (×4): 1 via ORAL
  Filled 2011-12-12 (×4): qty 1

## 2011-12-12 MED ORDER — CLONIDINE HCL 0.1 MG PO TABS
0.1000 mg | ORAL_TABLET | Freq: Three times a day (TID) | ORAL | Status: DC
  Administered 2011-12-12 – 2011-12-13 (×2): 0.1 mg via ORAL
  Filled 2011-12-12 (×4): qty 1

## 2011-12-12 MED ORDER — FOLIC ACID 1 MG PO TABS
1.0000 mg | ORAL_TABLET | Freq: Every day | ORAL | Status: DC
  Administered 2011-12-12 – 2011-12-15 (×4): 1 mg via ORAL
  Filled 2011-12-12 (×4): qty 1

## 2011-12-12 MED ORDER — LORAZEPAM 0.5 MG PO TABS
0.0000 mg | ORAL_TABLET | Freq: Four times a day (QID) | ORAL | Status: AC
  Administered 2011-12-13: 1 mg via ORAL
  Administered 2011-12-13: 2 mg via ORAL
  Administered 2011-12-13: 1 mg via ORAL
  Filled 2011-12-12: qty 4
  Filled 2011-12-12 (×2): qty 2

## 2011-12-12 MED ORDER — NICOTINE 14 MG/24HR TD PT24
14.0000 mg | MEDICATED_PATCH | Freq: Every day | TRANSDERMAL | Status: DC
  Administered 2011-12-12 – 2011-12-15 (×4): 14 mg via TRANSDERMAL
  Filled 2011-12-12 (×5): qty 1

## 2011-12-12 MED ORDER — THIAMINE HCL 100 MG/ML IJ SOLN
100.0000 mg | Freq: Every day | INTRAMUSCULAR | Status: DC
  Filled 2011-12-12 (×2): qty 1

## 2011-12-12 MED ORDER — SODIUM CHLORIDE 0.9 % IJ SOLN
3.0000 mL | Freq: Two times a day (BID) | INTRAMUSCULAR | Status: DC
  Administered 2011-12-12 – 2011-12-15 (×8): 3 mL via INTRAVENOUS

## 2011-12-12 MED ORDER — DOCUSATE SODIUM 100 MG PO CAPS
100.0000 mg | ORAL_CAPSULE | Freq: Two times a day (BID) | ORAL | Status: DC
  Administered 2011-12-12 – 2011-12-15 (×7): 100 mg via ORAL
  Filled 2011-12-12 (×8): qty 1

## 2011-12-12 MED ORDER — ACETAMINOPHEN 325 MG PO TABS
650.0000 mg | ORAL_TABLET | Freq: Four times a day (QID) | ORAL | Status: DC | PRN
  Administered 2011-12-14: 325 mg via ORAL
  Filled 2011-12-12: qty 1

## 2011-12-12 MED ORDER — INFLUENZA VIRUS VACC SPLIT PF IM SUSP
0.5000 mL | INTRAMUSCULAR | Status: AC
  Administered 2011-12-12: 0.5 mL via INTRAMUSCULAR
  Filled 2011-12-12: qty 0.5

## 2011-12-12 NOTE — ED Notes (Signed)
Attempted to call report to [facility] she is unable to come to phone at this time

## 2011-12-12 NOTE — Progress Notes (Signed)
The patient's admission history and physical was reviewed in detail.  His database was reviewed in detail.  The patient was examined at the bedside during rounds earlier this morning.  Adjustments were made in his antihypertensive regimen.  Lonia Blood, MD [facility] Office  [phone] Pager [phone]  On-Call/Text Page:      [email or web address]      password [facility]

## 2011-12-12 NOTE — H&P (Signed)
PCP: None   Chief Complaint: Blurry vision   HPI: Joseph Robbins is an 65 y.o. male who was incarcerated for most of his life, with history of severe hypertension, noncompliant, polysubstance abuse including crack cocaine, tobacco use, and significant alcohol use, presents to the emergency room because he was experiencing blurry vision. This episode was transient and did not recur. He was found to have severe hypertension with  blood pressure of 240/125. He denied any chest pain or shortness of breath. He is not have headache, confusion, slurred speech or focal weakness. Evaluation in the emergency room included a rather unremarkable CBC, low albumin of 2.7, normal creatinine and electrolytes. The urinalysis however did show moderate hemoglobin in the urine. He was given 10 mg lisinopril by the emergency room resident, and hospitalist was asked to admit patient. He also was found to have trichomonas vaginalis and his urinalysis. 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  In addition to crack cocaine, he admitted to  drinking about 6-12 beers per day. A urinary drug screen showed a presence of cocaine.  Rewiew of Systems:  The patient denies anorexia, fever, weight loss,, vision loss, decreased hearing, hoarseness, chest pain, syncope, dyspnea on exertion, peripheral edema, balance deficits, hemoptysis, abdominal pain, melena, hematochezia, severe indigestion/heartburn, hematuria, incontinence, genital sores, muscle weakness, suspicious skin lesions, transient blindness, difficulty walking, depression, unusual weight change, abnormal bleeding, enlarged lymph nodes, angioedema, and breast masses.   Past Medical History  Diagnosis Date  . Hypertension   . Hepatitis C     History reviewed. No pertinent past surgical history.  Medications:  HOME MEDS: Prior to Admission medications   Medication Sig Start Date End Date Taking? Authorizing Provider  Pseudoeph-Doxylamine-DM-APAP  (NYQUIL PO) Take 10 mLs by mouth at bedtime as needed. For cold symptoms   Yes Historical Provider, MD  Pseudoephedrine-APAP-DM (DAYQUIL PO) Take 10 mLs by mouth daily as needed. For cold symptoms   Yes Historical Provider, MD     Allergies:  Allergies  Allergen Reactions  . Penicillins Anaphylaxis    Social History:   reports that he has been smoking Cigarettes.  He has a 12.5 pack-year smoking history. He does not have any smokeless tobacco history on file. He reports that he drinks about 3 ounces of alcohol per week. He reports that he uses illicit drugs (Cocaine and "Crack" cocaine).  Family History: History reviewed. No pertinent family history.   Physical Exam: Filed Vitals:   12/12/11 0015 12/12/11 0016 12/12/11 0057 12/12/11 0117  BP: 191/90 191/90 158/99 203/87  Pulse: 61 66 74 64  Temp:   97.9 F (36.6 C) 97.9 F (36.6 C)  TempSrc:   Oral Oral  Resp: 23 29 26 21   Height:   6' (1.829 m)   Weight:   71.2 kg (156 lb 15.5 oz)   SpO2: 98% 100% 100% 100%   Blood pressure 203/87, pulse 64, temperature 97.9 F (36.6 C), temperature source Oral, resp. rate 21, height 6' (1.829 m), weight 71.2 kg (156 lb 15.5 oz), SpO2 100.00%.  GEN:  Pleasant person lying in the stretcher in no acute distress; cooperative with exam. He appeared quite 10 and malnourished PSYCH:  alert and oriented x4; does not appear anxious does not appear depressed; affect is normal HEENT: Mucous membranes pink and anicteric; PERRLA; EOM intact; no cervical lymphadenopathy nor thyromegaly or carotid bruit; no JVD; Breasts:: Not examined CHEST WALL: No tenderness CHEST: Normal respiration, clear to auscultation bilaterally HEART: Regular rate and rhythm;  no murmurs rubs or gallops BACK: No kyphosis or scoliosis; no CVA tenderness ABDOMEN: Obese, soft non-tender; no masses, no organomegaly, normal abdominal bowel sounds; no pannus; no intertriginous candida. Rectal Exam: Not done EXTREMITIES: No bone or  joint deformity; age-appropriate arthropathy of the hands and knees; no edema; no ulcerations. Genitalia: not examined PULSES: 2+ and symmetric SKIN: Normal hydration no rash or ulceration CNS: Cranial nerves 2-12 grossly intact no focal neurologic deficit   Labs & Imaging Results for orders placed during the hospital encounter of 12/11/11 (from the past 48 hour(s))  CBC     Status: Normal   Collection Time   12/11/11  5:16 PM      Component Value Range Comment   WBC 4.2  4.0 - 10.5 (K/uL)    RBC 4.33  4.22 - 5.81 (MIL/uL)    Hemoglobin 13.0  13.0 - 17.0 (g/dL)    HCT 16.1  09.6 - 04.5 (%)    MCV 91.0  78.0 - 100.0 (fL)    MCH 30.0  26.0 - 34.0 (pg)    MCHC 33.0  30.0 - 36.0 (g/dL)    RDW 40.9  81.1 - 91.4 (%)    Platelets 155  150 - 400 (K/uL)   COMPREHENSIVE METABOLIC PANEL     Status: Abnormal   Collection Time   12/11/11  5:16 PM      Component Value Range Comment   Sodium 139  135 - 145 (mEq/L)    Potassium 4.6  3.5 - 5.1 (mEq/L)    Chloride 102  96 - 112 (mEq/L)    CO2 27  19 - 32 (mEq/L)    Glucose, Bld 80  70 - 99 (mg/dL)    BUN 19  6 - 23 (mg/dL)    Creatinine, Ser 7.82  0.50 - 1.35 (mg/dL)    Calcium 9.0  8.4 - 10.5 (mg/dL)    Total Protein 7.9  6.0 - 8.3 (g/dL)    Albumin 2.7 (*) 3.5 - 5.2 (g/dL)    AST 45 (*) 0 - 37 (U/L)    ALT 25  0 - 53 (U/L)    Alkaline Phosphatase 93  39 - 117 (U/L)    Total Bilirubin 0.5  0.3 - 1.2 (mg/dL)    GFR calc non Af Amer 55 (*) >90 (mL/min)    GFR calc Af Amer 63 (*) >90 (mL/min)   URINALYSIS, ROUTINE W REFLEX MICROSCOPIC     Status: Abnormal   Collection Time   12/11/11  6:59 PM      Component Value Range Comment   Color, Urine YELLOW  YELLOW     APPearance CLEAR  CLEAR     Specific Gravity, Urine 1.011  1.005 - 1.030     pH 7.0  5.0 - 8.0     Glucose, UA NEGATIVE  NEGATIVE (mg/dL)    Hgb urine dipstick MODERATE (*) NEGATIVE     Bilirubin Urine NEGATIVE  NEGATIVE     Ketones, ur NEGATIVE  NEGATIVE (mg/dL)    Protein, ur  956 (*) NEGATIVE (mg/dL)    Urobilinogen, UA 1.0  0.0 - 1.0 (mg/dL)    Nitrite NEGATIVE  NEGATIVE     Leukocytes, UA NEGATIVE  NEGATIVE    URINE RAPID DRUG SCREEN (HOSP PERFORMED)     Status: Normal   Collection Time   12/11/11  6:59 PM      Component Value Range Comment   Opiates NONE DETECTED  NONE DETECTED     Cocaine  NONE DETECTED  NONE DETECTED     Benzodiazepines NONE DETECTED  NONE DETECTED     Amphetamines NONE DETECTED  NONE DETECTED     Tetrahydrocannabinol NONE DETECTED  NONE DETECTED     Barbiturates NONE DETECTED  NONE DETECTED    URINE MICROSCOPIC-ADD ON     Status: Abnormal   Collection Time   12/11/11  6:59 PM      Component Value Range Comment   Squamous Epithelial / LPF FEW (*) RARE     WBC, UA 3-6  <3 (WBC/hpf)    RBC / HPF 0-2  <3 (RBC/hpf)    Bacteria, UA RARE  RARE     Urine-Other TRICHOMONAS PRESENT   REPEATED TO VERIFY   Dg Chest 2 View  12/11/2011  *RADIOLOGY REPORT*  Clinical Data: Hypertension and cough.  History of tobacco use.  CHEST - 2 VIEW  Comparison: 1415 hours and prior chest x-ray dated 01/16/2005.  Findings: Stable severe chronic lung disease with multiple areas of parenchymal scarring noted bilaterally.  No evidence of pneumothorax, edema, focal consolidation, pleural fluid or obvious pulmonary nodule.  Heart size and mediastinal contours are stable.  IMPRESSION: Stable chronic lung disease.  Original Report Authenticated By: Reola Calkins, M.D.   Dg Chest 2 View  12/11/2011  *RADIOLOGY REPORT*  Clinical Data: Cough for several weeks with smoking history  CHEST - 2 VIEW  Comparison: 01/16/2005  Findings: Heart size and vascular pattern are normal.  There is unchanged moderately heavy interstitial opacification involving the bilateral mid to lower lung zones.  There is some extension into the lateral right upper lobe.  There is no significant interval change in the appearance when compared to prior study.  IMPRESSION: Chronic pulmonary  parenchymal scarring.  No acute findings.  Original Report Authenticated By: [id number]      Assessment Present on Admission:  .Polysubstance abuse .Alcoholism /alcohol abuse Hypertensive emergency Infection with Trichomonas vaginalis Severe noncompliance  PLAN: I will admit him to the step down and get his blood pressure under better control slowly. Will continue lisinopril at 20 mg per day, use IV hydralazine as needed. I discussed thoroughly about his dangerous behavior and urge him to change.  He needs to be more compliant with his medications. We'll give him nicotine patch. He is at risk for alcohol withdrawal and will be put him CIWA protocol with Ativan. With respect to his STD, he would need to be treated with Flagyl, however, I would wait just to be sure that there will not be any disulfiram reaction with his current alcohol use. Perhaps in a day or 2, he can be given Flagyl. I have also obtain an HIV test for him.   Other plans as per orders.    Jock Mahon 12/12/2011, 2:27 AM

## 2011-12-12 NOTE — ED Provider Notes (Signed)
Medical screening examination/treatment/procedure(s) were performed by non-physician practitioner and as supervising physician I was immediately available for consultation/collaboration.   [facility]; MD   Sharin Grave, MD 12/12/11 [phone]

## 2011-12-12 NOTE — Progress Notes (Signed)
Pt smokes 1/2 ppd and is currently on the 14 mg patch here at the hospital. He wants to quit on patches. Recommended 14 mg patch x 2-4 weeks followed by the 7 mg patch x 2 -4 weeks. Discussed patch use instructions. Referred to 1-800 quit now for f/u and support. Discussed oral fixation substitutes, second hand smoke and in home smoking policy. Reviewed and gave pt Written education/contact information.

## 2011-12-12 NOTE — ED Notes (Signed)
Patient denies pain and is resting comfortably.  

## 2011-12-12 NOTE — ED Notes (Signed)
[id number] READY

## 2011-12-12 NOTE — Progress Notes (Signed)
  Echocardiogram 2D Echocardiogram has been performed.  Cathie Beams Deneen 12/12/2011, 12:07 PM

## 2011-12-13 LAB — BASIC METABOLIC PANEL
BUN: 20 mg/dL (ref 6–23)
GFR calc Af Amer: 54 mL/min — ABNORMAL LOW (ref >90)
GFR calc non Af Amer: 46 mL/min — ABNORMAL LOW (ref >90)
Potassium: 4 mEq/L (ref 3.5–5.1)

## 2011-12-13 MED ORDER — CLONIDINE HCL 0.1 MG PO TABS
0.1000 mg | ORAL_TABLET | Freq: Three times a day (TID) | ORAL | Status: DC
  Administered 2011-12-14 – 2011-12-15 (×4): 0.1 mg via ORAL
  Filled 2011-12-13 (×7): qty 1

## 2011-12-13 MED ORDER — LISINOPRIL 20 MG PO TABS
20.0000 mg | ORAL_TABLET | Freq: Every day | ORAL | Status: DC
  Administered 2011-12-15: 20 mg via ORAL
  Filled 2011-12-13 (×2): qty 1

## 2011-12-13 NOTE — ED Provider Notes (Signed)
I saw and evaluated the patient, reviewed the resident's note and I agree with the findings and plan. Pt with hx of hypertension with significant hypertension in the ED.  Has no outpatient follow up, is not taking home meds.  No evidence of end organ damage on workup in the ED, except LVH on ekg.  Pt admitted to triad for further evaluation and management  Ethelda Chick, MD 12/13/11 [phone]

## 2011-12-14 MED ORDER — METRONIDAZOLE 500 MG PO TABS
2000.0000 mg | ORAL_TABLET | Freq: Once | ORAL | Status: AC
  Administered 2011-12-14: 2000 mg via ORAL
  Filled 2011-12-14 (×2): qty 4

## 2011-12-14 NOTE — Progress Notes (Signed)
Subjective: He has been sedated with Ativan based on elevated CIWA score.   Objective: Blood pressure 158/65, pulse 78, temperature 97.5 F (36.4 C), temperature source Oral, resp. rate 19, height 6' (1.829 m), weight 68.539 kg (151 lb 1.6 oz), SpO2 95.00%. Weight change: -1.861 kg (-4 lb 1.7 oz)  Intake/Output Summary (Last 24 hours) at 12/14/11 0918 Last data filed at 12/14/11 0900  Gross per 24 hour  Intake   1140 ml  Output    100 ml  Net   1040 ml    Physical Exam: General appearance: Sedated. Calmly sleeping.  Head: Normocephalic, without obvious abnormality, atraumatic Eyes: conjunctivae/corneas clear. PERRL, EOM's intact. Fundi benign. Lungs: clear to auscultation bilaterally Heart: regular rate and rhythm, S1, S2 normal, no murmur, click, rub or gallop Abdomen: soft, non-tender; bowel sounds normal; no masses,  no organomegaly Extremities: extremities normal, atraumatic, no cyanosis or edema  Lab Results:  Basename 12/13/11 0625 12/12/11 0500  NA 139 138  K 4.0 3.6  CL 103 104  CO2 28 29  GLUCOSE 105* 88  BUN 20 19  CREATININE 1.50* 1.39*  CALCIUM 8.9 8.6  MG -- --  PHOS -- --    Basename 12/11/11 1716  AST 45*  ALT 25  ALKPHOS 93  BILITOT 0.5  PROT 7.9  ALBUMIN 2.7*   No results found for this basename: LIPASE:2,AMYLASE:2 in the last 72 hours  Basename 12/12/11 0500 12/11/11 1716  WBC 4.8 4.2  NEUTROABS -- --  HGB 12.2* 13.0  HCT 37.1* 39.4  MCV 90.9 91.0  PLT 153 155   No results found for this basename: CKTOTAL:3,CKMB:3,CKMBINDEX:3,TROPONINI:3 in the last 72 hours No components found with this basename: POCBNP:3 No results found for this basename: DDIMER:2 in the last 72 hours No results found for this basename: HGBA1C:2 in the last 72 hours No results found for this basename: CHOL:2,HDL:2,LDLCALC:2,TRIG:2,CHOLHDL:2,LDLDIRECT:2 in the last 72 hours No results found for this basename: TSH,T4TOTAL,FREET3,T3FREE,THYROIDAB in the last 72  hours No results found for this basename: VITAMINB12:2,FOLATE:2,FERRITIN:2,TIBC:2,IRON:2,RETICCTPCT:2 in the last 72 hours  Micro Results: Recent Results (from the past 240 hour(s))  MRSA PCR SCREENING     Status: Normal   Collection Time   12/12/11  1:00 AM      Component Value Range Status Comment   MRSA by PCR NEGATIVE  NEGATIVE  Final     Studies/Results: Dg Chest 2 View  12/11/2011  *RADIOLOGY REPORT*  Clinical Data: Hypertension and cough.  History of tobacco use.  CHEST - 2 VIEW  Comparison: 1415 hours and prior chest x-ray dated 01/16/2005.  Findings: Stable severe chronic lung disease with multiple areas of parenchymal scarring noted bilaterally.  No evidence of pneumothorax, edema, focal consolidation, pleural fluid or obvious pulmonary nodule.  Heart size and mediastinal contours are stable.  IMPRESSION: Stable chronic lung disease.  Original Report Authenticated By: Reola Calkins, M.D.   Dg Chest 2 View  12/11/2011  *RADIOLOGY REPORT*  Clinical Data: Cough for several weeks with smoking history  CHEST - 2 VIEW  Comparison: 01/16/2005  Findings: Heart size and vascular pattern are normal.  There is unchanged moderately heavy interstitial opacification involving the bilateral mid to lower lung zones.  There is some extension into the lateral right upper lobe.  There is no significant interval change in the appearance when compared to prior study.  IMPRESSION: Chronic pulmonary parenchymal scarring.  No acute findings.  Original Report Authenticated By: [id number]    Medications: Scheduled Meds:   .  amLODipine  10 mg Oral Daily  . cloNIDine  0.1 mg Oral TID  . docusate sodium  100 mg Oral BID  . folic acid  1 mg Oral Daily  . hydrochlorothiazide  12.5 mg Oral Daily  . lisinopril  20 mg Oral Daily  . LORazepam  0-4 mg Oral Q6H   Followed by  . LORazepam  0-4 mg Oral Q12H  . mulitivitamin with minerals  1 tablet Oral Daily  . nicotine  14 mg Transdermal Q breakfast  .  sodium chloride  3 mL Intravenous Q12H  . thiamine  100 mg Oral Daily  . DISCONTD: cloNIDine  0.1 mg Oral TID  . DISCONTD: lisinopril  20 mg Oral Daily  . DISCONTD: thiamine  100 mg Intravenous Daily   Continuous Infusions:  PRN Meds:.sodium chloride, acetaminophen, acetaminophen, hydrALAZINE, HYDROcodone-acetaminophen, LORazepam, LORazepam, ondansetron (ZOFRAN) IV, ondansetron, sodium chloride  Assessment/Plan: Principal Problem:  *Hypertensive urgency- BP has improved and is actually low. I will be placing parameters on antihypertensives.   Active Problems:    Alcoholism /alcohol abuse/ Alcohol withdrawal - continue to follow CIWA scale until acute withdrawal symptoms resolved.   Trichomonas vaginalis infection, male Polysubstance abuse- he admited, on admisstion, to using cocaine in the past.  Grade 1 dialstolic dysfunction noted on ECHO today.  Hepatitis C Nicotine abuse Elevated Creatinine- It appears that his creatinine has been normal in the past but this was last checked about 2 yrs ago. Will continue to monitor for improvement.    LOS: 3 days   [employer] [phone] 12/13/2011, 9:18 AM

## 2011-12-14 NOTE — Progress Notes (Signed)
Subjective: Awake and alert today. Does not admit to feeling shaky, anxious or having hallucinations. When asked about whether he has been in alcohol withdrawal in the past, he states that he did not think just drinking beer would be a problem. He is noted to have a cough and when asked about it, he states he has had it for about 2 weeks. He does not take any medication at home and does not have a physician but has been looking for one.   Objective: Blood pressure 158/65, pulse 78, temperature 97.5 F (36.4 C), temperature source Oral, resp. rate 19, height 6' (1.829 m), weight 68.539 kg (151 lb 1.6 oz), SpO2 95.00%. Weight change: -1.861 kg (-4 lb 1.7 oz)  Intake/Output Summary (Last 24 hours) at 12/14/11 2956 Last data filed at 12/14/11 0900  Gross per 24 hour  Intake   1140 ml  Output    100 ml  Net   1040 ml    Physical Exam: General appearance: awake and alert but clearly still weak and shaky which is noted when asked to pull himself up in bed Head: Normocephalic, without obvious abnormality, atraumatic Eyes: conjunctivae/corneas clear. PERRL, EOM's intact.  Lungs: clear to auscultation bilaterally Heart: regular rate and rhythm, S1, S2 normal, no murmur, click, rub or gallop Abdomen: soft, non-tender; bowel sounds normal; no masses,  no organomegaly Extremities: extremities normal, atraumatic, no cyanosis or edema  Lab Results:  Basename 12/13/11 0625 12/12/11 0500  NA 139 138  K 4.0 3.6  CL 103 104  CO2 28 29  GLUCOSE 105* 88  BUN 20 19  CREATININE 1.50* 1.39*  CALCIUM 8.9 8.6  MG -- --  PHOS -- --    Basename 12/11/11 1716  AST 45*  ALT 25  ALKPHOS 93  BILITOT 0.5  PROT 7.9  ALBUMIN 2.7*   No results found for this basename: LIPASE:2,AMYLASE:2 in the last 72 hours  Basename 12/12/11 0500 12/11/11 1716  WBC 4.8 4.2  NEUTROABS -- --  HGB 12.2* 13.0  HCT 37.1* 39.4  MCV 90.9 91.0  PLT 153 155   No results found for this basename:  CKTOTAL:3,CKMB:3,CKMBINDEX:3,TROPONINI:3 in the last 72 hours No components found with this basename: POCBNP:3 No results found for this basename: DDIMER:2 in the last 72 hours No results found for this basename: HGBA1C:2 in the last 72 hours No results found for this basename: CHOL:2,HDL:2,LDLCALC:2,TRIG:2,CHOLHDL:2,LDLDIRECT:2 in the last 72 hours No results found for this basename: TSH,T4TOTAL,FREET3,T3FREE,THYROIDAB in the last 72 hours No results found for this basename: VITAMINB12:2,FOLATE:2,FERRITIN:2,TIBC:2,IRON:2,RETICCTPCT:2 in the last 72 hours  Micro Results: Recent Results (from the past 240 hour(s))  MRSA PCR SCREENING     Status: Normal   Collection Time   12/12/11  1:00 AM      Component Value Range Status Comment   MRSA by PCR NEGATIVE  NEGATIVE  Final     Studies/Results: Dg Chest 2 View  12/11/2011  *RADIOLOGY REPORT*  Clinical Data: Hypertension and cough.  History of tobacco use.  CHEST - 2 VIEW  Comparison: 1415 hours and prior chest x-ray dated 01/16/2005.  Findings: Stable severe chronic lung disease with multiple areas of parenchymal scarring noted bilaterally.  No evidence of pneumothorax, edema, focal consolidation, pleural fluid or obvious pulmonary nodule.  Heart size and mediastinal contours are stable.  IMPRESSION: Stable chronic lung disease.  Original Report Authenticated By: Reola Calkins, M.D.   Dg Chest 2 View  12/11/2011  *RADIOLOGY REPORT*  Clinical Data: Cough for several weeks  with smoking history  CHEST - 2 VIEW  Comparison: 01/16/2005  Findings: Heart size and vascular pattern are normal.  There is unchanged moderately heavy interstitial opacification involving the bilateral mid to lower lung zones.  There is some extension into the lateral right upper lobe.  There is no significant interval change in the appearance when compared to prior study.  IMPRESSION: Chronic pulmonary parenchymal scarring.  No acute findings.  Original Report Authenticated  By: [id number]    Medications: Scheduled Meds:    . amLODipine  10 mg Oral Daily  . cloNIDine  0.1 mg Oral TID  . docusate sodium  100 mg Oral BID  . folic acid  1 mg Oral Daily  . hydrochlorothiazide  12.5 mg Oral Daily  . lisinopril  20 mg Oral Daily  . LORazepam  0-4 mg Oral Q6H   Followed by  . LORazepam  0-4 mg Oral Q12H  . mulitivitamin with minerals  1 tablet Oral Daily  . nicotine  14 mg Transdermal Q breakfast  . sodium chloride  3 mL Intravenous Q12H  . thiamine  100 mg Oral Daily  . DISCONTD: cloNIDine  0.1 mg Oral TID  . DISCONTD: lisinopril  20 mg Oral Daily  . DISCONTD: thiamine  100 mg Intravenous Daily   Continuous Infusions:  PRN Meds:.sodium chloride, acetaminophen, acetaminophen, hydrALAZINE, HYDROcodone-acetaminophen, LORazepam, LORazepam, ondansetron (ZOFRAN) IV, ondansetron, sodium chloride  Assessment/Plan: Principal Problem:  *Hypertensive urgency-  currently on Amlodipine, clonidine, lisinopril and HCTZ.  We will continue to monitor and adjust medications as needed. Avoid B Blocker due to his admitted use of Crack Cocaine.   Active Problems:    Alcoholism /alcohol abuse/ Alcohol withdrawal - Withdrawal symptoms have improved in the sense that he is no longer agitated. He is scoring 0 on the CIWA scale and therefore no longer is receiving Ativan. However he still appears a little tremulous to me and for now I will continue his CIWA assessments.    Trichomonas vaginalis infection, male- Will treat with Flagyl.  Polysubstance abuse- he admited, on admisstion, to using cocaine in the past.  Grade 1 dialstolic dysfunction noted on ECHO today.  Hepatitis C Nicotine abuse- currently has a Nicoderm patch and states to me that he will be quitting.  Elevated Creatinine- It appears that his creatinine has been normal in the past but this was last checked about 2 yrs ago. Will continue to monitor for improvement.   He will need assistance in finding a PCP. I will  transfer him out of step down today.    LOS: 3 days   [facility] [phone] 12/13/2011, 9:27 AM

## 2011-12-15 LAB — BASIC METABOLIC PANEL
Calcium: 8.8 mg/dL (ref 8.4–10.5)
Chloride: 103 mEq/L (ref 96–112)
Creatinine, Ser: 1.77 mg/dL — ABNORMAL HIGH (ref 0.50–1.35)
GFR calc Af Amer: 44 mL/min — ABNORMAL LOW (ref >90)
GFR calc non Af Amer: 38 mL/min — ABNORMAL LOW (ref >90)

## 2011-12-15 MED ORDER — LISINOPRIL 20 MG PO TABS: 20.0000 mg | ORAL_TABLET | Freq: Every day | ORAL | Status: DC

## 2011-12-15 MED ORDER — HYDROCODONE-ACETAMINOPHEN 5-325 MG PO TABS: 1.0000 | ORAL_TABLET | ORAL | Status: AC | PRN

## 2011-12-15 MED ORDER — AMLODIPINE BESYLATE 10 MG PO TABS: 10.0000 mg | ORAL_TABLET | Freq: Every day | ORAL | Status: DC

## 2011-12-15 MED ORDER — HYDROCHLOROTHIAZIDE 12.5 MG PO CAPS: 12.5000 mg | ORAL_CAPSULE | Freq: Every day | ORAL | Status: DC

## 2011-12-15 MED ORDER — NICOTINE 14 MG/24HR TD PT24: 1.0000 | MEDICATED_PATCH | Freq: Every day | TRANSDERMAL | Status: AC

## 2011-12-15 MED ORDER — CLONIDINE HCL 0.1 MG PO TABS: 0.1000 mg | ORAL_TABLET | Freq: Three times a day (TID) | ORAL | Status: DC

## 2011-12-15 NOTE — Progress Notes (Signed)
Physical Therapy Evaluation Patient Details Name: Joseph Robbins MRN: [id number] DOB: 1946/04/04 Today's Date: 12/15/2011  Problem List:  Patient Active Problem List  Diagnoses  . Hypertensive urgency  . Polysubstance abuse  . Alcoholism /alcohol abuse  . Trichomonas vaginalis infection, male    Past Medical History:  Past Medical History  Diagnosis Date  . Hypertension   . Hepatitis C    Past Surgical History: History reviewed. No pertinent past surgical history.  PT Assessment/Plan/Recommendation PT Assessment Clinical Impression Statement: Pt admitted for HTN urgency with decreased cognitive ability and balance deficits placing pt at a significant fall risk. Pt will benefit from therapy to address problem list and increase independence to decrease burden of care.  PT Recommendation/Assessment: Patient will need skilled PT in the acute care venue PT Problem List: Decreased cognition;Decreased balance;Decreased mobility Barriers to Discharge: Decreased caregiver support Barriers to Discharge Comments: Pt lives alone but states sister or friend may be able to stay with him PT Therapy Diagnosis : Abnormality of gait;Altered mental status PT Plan PT Frequency: Min 3X/week PT Treatment/Interventions: Gait training;Functional mobility training;Balance training;Patient/family education;Therapeutic activities;Cognitive remediation PT Recommendation Recommendations for Other Services: Speech consult (cognitive eval) Follow Up Recommendations: 24 hour supervision/assistance;Outpatient PT Equipment Recommended: Other (comment) (will continue to assess) PT Goals  Acute Rehab PT Goals PT Goal Formulation: With patient Time For Goal Achievement: 7 days Pt will Ambulate: >150 feet;with modified independence PT Goal: Ambulate - Progress: Other (comment) Additional Goals Additional Goal #1: Pt will score > 46 on Berg to decrease fall risk. PT Goal: Additional Goal #1 - Progress: Other  (comment)  PT Evaluation Precautions/Restrictions  Precautions Precautions: Fall Prior Functioning  Home Living Lives With: Alone Type of Home: House Home Layout: One level Home Access: Stairs to enter Entrance Stairs-Rails: Right Entrance Stairs-Number of Steps: 3 Bathroom Shower/Tub: Engineer, manufacturing systems: Standard Home Adaptive Equipment: Straight cane Prior Function Level of Independence: Independent with basic ADLs;Independent with transfers;Independent with homemaking with ambulation;Independent with gait Driving: Yes Vocation: Unemployed Cognition Cognition Arousal/Alertness: Lethargic Overall Cognitive Status: Impaired Attention: Impaired Current Attention Level: Selective Memory: Appears impaired Orientation Level: Oriented to person;Oriented to place;Disoriented to time;Disoriented to situation Safety/Judgement: Decreased safety judgement for tasks assessed Decreased Safety/Judgement: Decreased awareness of need for assistance Problem Solving: Requires assistance for problem solving Cognition - Other Comments: Pt unable to solve simple money management item, Difficulty determining where room was with cueing for signs was able to locate room Sensation/Coordination   Extremity Assessment RUE Assessment RUE Assessment: Within Functional Limits LUE Assessment LUE Assessment: Within Functional Limits RLE Assessment RLE Assessment: Within Functional Limits LLE Assessment LLE Assessment: Within Functional Limits Mobility (including Balance) Bed Mobility Bed Mobility: Yes Supine to Sit: 7: Independent;HOB flat Transfers Transfers: Yes Sit to Stand: 7: Independent Stand to Sit: 7: Independent Stand Pivot Transfers: 6: Modified independent (Device/Increase time) Ambulation/Gait Ambulation/Gait: Yes Ambulation/Gait Assistance: 4: Min assist Ambulation/Gait Assistance Details (indicate cue type and reason): Min guard assist for safety and direction pt  with 1 LOB able to recover without assist Ambulation Distance (Feet): 350 Feet Assistive device: None Gait Pattern: Decreased stride length (forward head, decreased arm swing) Gait velocity: 30/26= 1.15 ft/sec gait speed placing pt as a recurrent fall risk Stairs: Yes Stairs Assistance: 5: Supervision Stairs Assistance Details (indicate cue type and reason): supervision for safety Stair Management Technique: One rail Right Number of Stairs: 4  Height of Stairs: 8  Wheelchair Mobility Wheelchair Mobility: No  Posture/Postural Control Posture/Postural Control: Postural limitations Postural  Limitations: forward head Berg Balance Test Sit to Stand: Able to stand without using hands and stabilize independently Standing Unsupported: Able to stand 2 minutes with supervision Sitting with Back Unsupported but Feet Supported on Floor or Stool: Able to sit safely and securely 2 minutes Stand to Sit: Sits safely with minimal use of hands Transfers: Able to transfer safely, minor use of hands Standing Unsupported with Eyes Closed: Able to stand 10 seconds safely Standing Ubsupported with Feet Together: Able to place feet together independently and stand for 1 minute with supervision From Standing, Reach Forward with Outstretched Arm: Can reach forward >12 cm safely (5") From Standing Position, Pick up Object from Floor: Able to pick up shoe safely and easily From Standing Position, Turn to Look Behind Over each Shoulder: Turn sideways only but maintains balance Turn 360 Degrees: Able to turn 360 degrees safely but slowly Standing Unsupported, Alternately Place Feet on Step/Stool: Able to stand independently and complete 8 steps >20 seconds Standing Unsupported, One Foot in Front: Loses balance while stepping or standing Standing on One Leg: Unable to try or needs assist to prevent fall Total Score: 40  Exercise    End of Session PT - End of Session Equipment Utilized During Treatment: Gait  belt Activity Tolerance: Patient tolerated treatment well Patient left: in chair;with call bell in reach Nurse Communication: Mobility status for transfers;Mobility status for ambulation General Behavior During Session: [facility] for tasks performed Cognition: Impaired  Delorse Lek 12/15/2011, 9:22 AM Toney Sang, PT [phone]

## 2011-12-15 NOTE — Progress Notes (Signed)
PT IS TO DC TO HOME TODAY WITH HH PT WITH [facility].  PT WILL BE STAYING WITH SISTER PAT AT DC AT [address] Willa Rough 12/15/2011 [phone] OR [phone]

## 2011-12-15 NOTE — Discharge Summary (Addendum)
DISCHARGE SUMMARY  Joseph Robbins  MR#: [id number]  DOB:1946/01/25  Date of Admission: 12/11/2011 Date of Discharge: 12/15/2011  Attending Physician:Lilibeth Opie Butler [place], MD  Patient's PCP:No primary provider on file.  Consults: None   Discharge Diagnoses: Principal Problem:  *Hypertensive urgency Active Problems:  Polysubstance abuse  Alcoholism /alcohol abuse  Trichomonas vaginalis infection, male   Radiology: Dg Chest 2 View  12/11/2011  *RADIOLOGY REPORT*  Clinical Data: Hypertension and cough.  History of tobacco use.  CHEST - 2 VIEW  Comparison: 1415 hours and prior chest x-ray dated 01/16/2005.  Findings: Stable severe chronic lung disease with multiple areas of parenchymal scarring noted bilaterally.  No evidence of pneumothorax, edema, focal consolidation, pleural fluid or obvious pulmonary nodule.  Heart size and mediastinal contours are stable.  IMPRESSION: Stable chronic lung disease.  Original Report Authenticated By: Reola Calkins, M.D.   Dg Chest 2 View  12/11/2011  *RADIOLOGY REPORT*  Clinical Data: Cough for several weeks with smoking history  CHEST - 2 VIEW  Comparison: 01/16/2005  Findings: Heart size and vascular pattern are normal.  There is unchanged moderately heavy interstitial opacification involving the bilateral mid to lower lung zones.  There is some extension into the lateral right upper lobe.  There is no significant interval change in the appearance when compared to prior study.  IMPRESSION: Chronic pulmonary parenchymal scarring.  No acute findings.  Original Report Authenticated By: [id number]    Laboratory: Results for orders placed during the hospital encounter of 12/11/11 (from the past 48 hour(s))  BASIC METABOLIC PANEL     Status: Abnormal   Collection Time   12/15/11  7:05 AM      Component Value Range Comment   Sodium 139  135 - 145 (mEq/L)    Potassium 3.7  3.5 - 5.1 (mEq/L)    Chloride 103  96 - 112 (mEq/L)    CO2 28  19 - 32 (mEq/L)    Glucose, Bld 92  70 - 99 (mg/dL)    BUN 29 (*) 6 - 23 (mg/dL)    Creatinine, Ser 9.14 (*) 0.50 - 1.35 (mg/dL)    Calcium 8.8  8.4 - 10.5 (mg/dL)    GFR calc non Af Amer 38 (*) >90 (mL/min)    GFR calc Af Amer 44 (*) >90 (mL/min)      Current Discharge Medication List    START taking these medications   Details  amLODipine (NORVASC) 10 MG tablet Take 1 tablet (10 mg total) by mouth daily. Qty: 30 tablet, Refills: 0         hydrochlorothiazide (MICROZIDE) 12.5 MG capsule Take 1 capsule (12.5 mg total) by mouth daily. Qty: 30 capsule, Refills: 0    HYDROcodone-acetaminophen (NORCO) 5-325 MG per tablet Take 1-2 tablets by mouth every 4 (four) hours as needed. Qty: 30 tablet, Refills: 0    nicotine (NICODERM CQ - DOSED IN MG/24 HOURS) 14 mg/24hr patch Place 1 patch onto the skin daily with breakfast. Qty: 28 patch, Refills: 0      STOP taking these medications     Pseudoeph-Doxylamine-DM-APAP (NYQUIL PO)      Pseudoephedrine-APAP-DM (DAYQUIL PO)        History of present illness: 65 year old male with known severe hypertension but noncompliant with medications. In addition he is polysubstance abuse involving crack cocaine tobacco use and alcohol use. He presented to the ER because of blurry vision but was found to have severe hypertension with a blood pressure 240/125. This was not associated with any  chest pain or shortness of breath. In addition he was not complaining of headache confusion slurred speech or focal weakness. Laboratory data in the emergency department was unremarkable although the urinalysis demonstrated moderate amount of hemoglobin. Apparently routine screening in the ER demonstrated Trichomonas and his urinalysis. The patient has a significant social history of incarceration from most of his life as well as being promiscuous with prostitutes. In addition to utilization of crack cocaine he admitted to drinking about 6-12 beers per day and his urine drug screen was  positive for cocaine. In the ER he was given lisinopril 10 mg by the attending ER physician and by the time the admitting physician arrived to admit the patient his blood pressure was down to 203/87. His admitting white count was 4200 with a hemoglobin of 13 and a platelet count of 155,000. Sodium was 139 potassium 4.6 BUN 19 creatinine 1.31.   Hospital Course: Principal Problem:  *Hypertensive urgency He was initially admitted to the step down ICU for aggressive blood pressure monitoring and control. He was started on lisinopril 20 mg daily as well as utilization of IV hydralazine when necessary. Subsequently his blood pressure improved with the addition of multiple medications and on date of discharge his blood pressure was well controlled on Norvasc 10 mg daily, Catapres 0.1 mg 3 times a day , hydrochlorothiazide 12.5 daily and lisinopril 20 mg by mouth daily. Because his creatinine is slowly increasing to 1.77 with a BUN of 29 we have opted at this time to discontinue the ACE inhibitor/lisinopril especially given the fact that this patient has a history of being noncompliant and not showing up for physician appointments and is at increased risk for renal damage from taking ACE inhibitor with thiazide diuretic. On the date of discharge his blood pressure was well controlled at 124/75. Given the chronicity of his hypertension this may actually be somewhat of a low blood pressure for this patient and could be contributing to the renal insufficiency we are seeing. After discussing with Dr. Fenton Malling 1 we've also decided to stop the clonidine for several reasons including relatively low blood pressure as well as concerns for rebound tachycardia and hypertension if he becomes noncompliant and stopped taking this medication. As recommended he followup with a primary care provider in the next 2 weeks for further blood pressure monitoring as well as laboratory analysis. He does not have a primary care physician and has  been given the number for [facility] so he can establish with a primary care physician.  Active Problems:  Polysubstance abuse/Alcoholism /alcohol abuse He has been counseled against utilization of not only alcohol and illegal drugs but tobacco this admission. When discussed today on date of discharge patient states he will no longer be drinking any alcohol or smoking cigarettes.  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 Further recommendations patient needs 24/ 7 followup at home. The patient will return to the home environment with his daughter who will take responsibility for the patient and his care. Apparently the same daughter also cares for their mother.   Trichomonas vaginalis infection, male Patient was given appropriate dosing of Flagyl this admission.   Day of Discharge BP 124/75  Pulse 58  Temp(Src) 97.4 F (36.3 C) (Oral)  Resp 18  Ht 6' (1.829 m)  Wt 69.6 kg (153 lb 7 oz)  BMI 20.81 kg/m2  SpO2 93%  Physical Exam:  General appearance: alert,  cooperative, appears older than stated age and no distress Resp: clear to auscultation bilaterally Cardio: regular rate and rhythm, S1, S2 normal, no murmur, click, rub or gallop GI: soft, non-tender; bowel sounds normal; no masses,  no organomegaly Extremities: extremities normal, atraumatic, no cyanosis or edema Neurologic: Grossly normal except for witnessed gait disturbance and imbalance  Follow-up: He and his family are to contact [facility] to obtain the number of a local physician accepting new medications/Medicare patients. It is recommended he attempt to followup with a physician in the next one to 2 weeks.  Disposition:  He is to discharge home with his sister for 24/7 care. We are also ordering home health physical therapy to continue at home due to gait disturbance and his mobility  issues.   Junious Silk, ANP pager [phone]

## 2011-12-15 NOTE — Progress Notes (Signed)
PT WILL BE DC'D TO HOME TODAY AND WILL NEED HH PT.  HH PT ARRANGED WITH [facility] SPOKE WITH SISTER PAT.  PT WILL BE DC'D TO HOME WITH HER, [facility] UPDATED OF PT'S DC LOCATION.

## 2011-12-15 NOTE — Discharge Summary (Signed)
I have examined the patient, discussed discharge plans with him and reviewed his chart. He will be going home with 24 hr supervision by his sister. We have advised him strongly against drinking, drug abuse, smoking and being compliant with his antihypertensives. He will hopefully establish care with a PCP and follow up for his hypertension.   Calvert Cantor MD [phone]

## 2014-03-07 ENCOUNTER — Other Ambulatory Visit (HOSPITAL_COMMUNITY): Payer: Self-pay | Admitting: Anesthesiology

## 2014-03-07 ENCOUNTER — Other Ambulatory Visit (HOSPITAL_COMMUNITY): Payer: Self-pay | Admitting: Urology

## 2014-03-07 DIAGNOSIS — C61 Malignant neoplasm of prostate: Secondary | ICD-10-CM

## 2014-03-20 ENCOUNTER — Encounter (HOSPITAL_COMMUNITY)
Admission: RE | Admit: 2014-03-20 | Discharge: 2014-03-20 | Disposition: A | Discharge status: Home / Self Care | Payer: Medicare Other | Source: Ambulatory Visit | Attending: Anesthesiology | Admitting: Anesthesiology

## 2014-03-20 DIAGNOSIS — C61 Malignant neoplasm of prostate: Secondary | ICD-10-CM | POA: Insufficient documentation

## 2014-03-20 MED ORDER — TECHNETIUM TC 99M MEDRONATE IV KIT
27.0000 | PACK | Freq: Once | INTRAVENOUS | Status: AC | PRN
  Administered 2014-03-20: 27 via INTRAVENOUS

## 2014-04-11 ENCOUNTER — Inpatient Hospital Stay (HOSPITAL_COMMUNITY)
Admission: EM | Admit: 2014-04-11 | Discharge: 2014-04-14 | DRG: 689 | Disposition: A | Discharge status: Skilled Nursing Facility | Payer: Medicare Other | Attending: Internal Medicine | Admitting: Internal Medicine

## 2014-04-11 ENCOUNTER — Emergency Department (HOSPITAL_COMMUNITY): Payer: Medicare Other

## 2014-04-11 ENCOUNTER — Encounter (HOSPITAL_COMMUNITY): Payer: Self-pay | Admitting: Emergency Medicine

## 2014-04-11 DIAGNOSIS — F0390 Unspecified dementia without behavioral disturbance: Secondary | ICD-10-CM | POA: Diagnosis present

## 2014-04-11 DIAGNOSIS — Q619 Cystic kidney disease, unspecified: Secondary | ICD-10-CM | POA: Diagnosis present

## 2014-04-11 DIAGNOSIS — C259 Malignant neoplasm of pancreas, unspecified: Secondary | ICD-10-CM | POA: Diagnosis present

## 2014-04-11 DIAGNOSIS — R4182 Altered mental status, unspecified: Secondary | ICD-10-CM | POA: Diagnosis present

## 2014-04-11 DIAGNOSIS — N390 Urinary tract infection, site not specified: Principal | ICD-10-CM | POA: Diagnosis present

## 2014-04-11 DIAGNOSIS — W57XXXA Bitten or stung by nonvenomous insect and other nonvenomous arthropods, initial encounter: Secondary | ICD-10-CM | POA: Diagnosis present

## 2014-04-11 DIAGNOSIS — F1020 Alcohol dependence, uncomplicated: Secondary | ICD-10-CM | POA: Diagnosis present

## 2014-04-11 DIAGNOSIS — B1920 Unspecified viral hepatitis C without hepatic coma: Secondary | ICD-10-CM | POA: Diagnosis present

## 2014-04-11 DIAGNOSIS — T148 Other injury of unspecified body region: Secondary | ICD-10-CM | POA: Diagnosis present

## 2014-04-11 DIAGNOSIS — I160 Hypertensive urgency: Secondary | ICD-10-CM

## 2014-04-11 DIAGNOSIS — N281 Cyst of kidney, acquired: Secondary | ICD-10-CM

## 2014-04-11 DIAGNOSIS — A599 Trichomoniasis, unspecified: Secondary | ICD-10-CM | POA: Diagnosis present

## 2014-04-11 DIAGNOSIS — A5902 Trichomonal prostatitis: Secondary | ICD-10-CM

## 2014-04-11 DIAGNOSIS — R64 Cachexia: Secondary | ICD-10-CM | POA: Diagnosis present

## 2014-04-11 DIAGNOSIS — B888 Other specified infestations: Secondary | ICD-10-CM | POA: Diagnosis present

## 2014-04-11 DIAGNOSIS — F17200 Nicotine dependence, unspecified, uncomplicated: Secondary | ICD-10-CM | POA: Diagnosis present

## 2014-04-11 DIAGNOSIS — J189 Pneumonia, unspecified organism: Secondary | ICD-10-CM | POA: Diagnosis present

## 2014-04-11 DIAGNOSIS — Z79899 Other long term (current) drug therapy: Secondary | ICD-10-CM

## 2014-04-11 DIAGNOSIS — I10 Essential (primary) hypertension: Secondary | ICD-10-CM | POA: Diagnosis present

## 2014-04-11 DIAGNOSIS — N179 Acute kidney failure, unspecified: Secondary | ICD-10-CM | POA: Diagnosis present

## 2014-04-11 DIAGNOSIS — R600 Localized edema: Secondary | ICD-10-CM | POA: Diagnosis present

## 2014-04-11 DIAGNOSIS — IMO0001 Reserved for inherently not codable concepts without codable children: Secondary | ICD-10-CM | POA: Diagnosis present

## 2014-04-11 DIAGNOSIS — G9340 Encephalopathy, unspecified: Secondary | ICD-10-CM | POA: Diagnosis present

## 2014-04-11 HISTORY — DX: Malignant neoplasm of pancreas, unspecified: C25.9

## 2014-04-11 HISTORY — DX: Unspecified dementia, unspecified severity, without behavioral disturbance, psychotic disturbance, mood disturbance, and anxiety: F03.90

## 2014-04-11 LAB — COMPREHENSIVE METABOLIC PANEL
ALT: 34 U/L (ref 0–53)
AST: 69 U/L — AB (ref 0–37)
Albumin: 2.8 g/dL — ABNORMAL LOW (ref 3.5–5.2)
Alkaline Phosphatase: 169 U/L — ABNORMAL HIGH (ref 39–117)
BUN: 35 mg/dL — ABNORMAL HIGH (ref 6–23)
CALCIUM: 8.9 mg/dL (ref 8.4–10.5)
CO2: 24 mEq/L (ref 19–32)
CREATININE: 1.59 mg/dL — AB (ref 0.50–1.35)
Chloride: 105 mEq/L (ref 96–112)
GFR calc Af Amer: 49 mL/min — ABNORMAL LOW (ref >90)
GFR, EST NON AFRICAN AMERICAN: 42 mL/min — AB (ref >90)
Glucose, Bld: 104 mg/dL — ABNORMAL HIGH (ref 70–99)
Potassium: 4.5 mEq/L (ref 3.7–5.3)
Sodium: 143 mEq/L (ref 137–147)
TOTAL PROTEIN: 8.3 g/dL (ref 6.0–8.3)
Total Bilirubin: 0.4 mg/dL (ref 0.3–1.2)

## 2014-04-11 LAB — CBC WITH DIFFERENTIAL/PLATELET
BASOS ABS: 0 10*3/uL (ref 0.0–0.1)
BASOS PCT: 0 % (ref 0–1)
EOS ABS: 0.1 10*3/uL (ref 0.0–0.7)
EOS PCT: 2 % (ref 0–5)
HEMATOCRIT: 33.9 % — AB (ref 39.0–52.0)
Hemoglobin: 10.7 g/dL — ABNORMAL LOW (ref 13.0–17.0)
LYMPHS PCT: 23 % (ref 12–46)
Lymphs Abs: 1.1 10*3/uL (ref 0.7–4.0)
MCH: 27.7 pg (ref 26.0–34.0)
MCHC: 31.6 g/dL (ref 30.0–36.0)
MCV: 87.8 fL (ref 78.0–100.0)
MONO ABS: 0.6 10*3/uL (ref 0.1–1.0)
Monocytes Relative: 13 % — ABNORMAL HIGH (ref 3–12)
Neutro Abs: 3.1 10*3/uL (ref 1.7–7.7)
Neutrophils Relative %: 62 % (ref 43–77)
Platelets: 263 10*3/uL (ref 150–400)
RBC: 3.86 MIL/uL — ABNORMAL LOW (ref 4.22–5.81)
RDW: 13.6 % (ref 11.5–15.5)
WBC: 5 10*3/uL (ref 4.0–10.5)

## 2014-04-11 LAB — URINALYSIS, ROUTINE W REFLEX MICROSCOPIC
Bilirubin Urine: NEGATIVE
Glucose, UA: NEGATIVE mg/dL
KETONES UR: NEGATIVE mg/dL
NITRITE: NEGATIVE
PH: 5.5 (ref 5.0–8.0)
Protein, ur: 100 mg/dL — AB
Specific Gravity, Urine: 1.019 (ref 1.005–1.030)
Urobilinogen, UA: 1 mg/dL (ref 0.0–1.0)

## 2014-04-11 LAB — URINE MICROSCOPIC-ADD ON

## 2014-04-11 LAB — TROPONIN I

## 2014-04-11 LAB — ETHANOL: Alcohol, Ethyl (B): <11 mg/dL (ref 0–11)

## 2014-04-11 LAB — I-STAT CG4 LACTIC ACID, ED: Lactic Acid, Venous: 1.08 mmol/L (ref 0.5–2.2)

## 2014-04-11 LAB — CBG MONITORING, ED: GLUCOSE-CAPILLARY: 97 mg/dL (ref 70–99)

## 2014-04-11 MED ORDER — LEVOFLOXACIN IN D5W 750 MG/150ML IV SOLN
750.0000 mg | INTRAVENOUS | Status: DC
  Filled 2014-04-11: qty 150

## 2014-04-11 MED ORDER — HYDRALAZINE HCL 25 MG PO TABS
25.0000 mg | ORAL_TABLET | Freq: Three times a day (TID) | ORAL | Status: DC
  Administered 2014-04-12 – 2014-04-14 (×8): 25 mg via ORAL
  Filled 2014-04-11 (×10): qty 1

## 2014-04-11 MED ORDER — HYDROCODONE-ACETAMINOPHEN 7.5-325 MG PO TABS: 1.0000 | ORAL_TABLET | Freq: Two times a day (BID) | ORAL | Status: DC | PRN

## 2014-04-11 MED ORDER — GABAPENTIN 300 MG PO CAPS
300.0000 mg | ORAL_CAPSULE | Freq: Every day | ORAL | Status: DC
  Administered 2014-04-12 – 2014-04-13 (×3): 300 mg via ORAL
  Filled 2014-04-11 (×4): qty 1

## 2014-04-11 MED ORDER — AMLODIPINE BESYLATE 10 MG PO TABS
10.0000 mg | ORAL_TABLET | Freq: Every day | ORAL | Status: DC
  Administered 2014-04-12 – 2014-04-14 (×3): 10 mg via ORAL
  Filled 2014-04-11 (×3): qty 1

## 2014-04-11 MED ORDER — HEPARIN SODIUM (PORCINE) 5000 UNIT/ML IJ SOLN
5000.0000 [IU] | Freq: Three times a day (TID) | INTRAMUSCULAR | Status: DC
Start: 2014-04-11 — End: 2014-04-14
  Administered 2014-04-12 – 2014-04-14 (×9): 5000 [IU] via SUBCUTANEOUS
  Filled 2014-04-11 (×11): qty 1

## 2014-04-11 MED ORDER — LEVOFLOXACIN IN D5W 750 MG/150ML IV SOLN: 750.0000 mg | INTRAVENOUS | Status: DC

## 2014-04-11 MED ORDER — PANTOPRAZOLE SODIUM 40 MG PO TBEC
40.0000 mg | DELAYED_RELEASE_TABLET | Freq: Every day | ORAL | Status: DC
  Administered 2014-04-12 – 2014-04-14 (×3): 40 mg via ORAL
  Filled 2014-04-11 (×3): qty 1

## 2014-04-11 MED ORDER — METRONIDAZOLE 500 MG PO TABS: 500.0000 mg | ORAL_TABLET | Freq: Two times a day (BID) | ORAL | Status: DC

## 2014-04-11 MED ORDER — LORATADINE 10 MG PO TABS
10.0000 mg | ORAL_TABLET | Freq: Every day | ORAL | Status: DC
  Administered 2014-04-12 – 2014-04-14 (×3): 10 mg via ORAL
  Filled 2014-04-11 (×3): qty 1

## 2014-04-11 MED ORDER — SODIUM CHLORIDE 0.9 % IV SOLN
INTRAVENOUS | Status: DC
  Administered 2014-04-11: 23:00:00 via INTRAVENOUS
  Administered 2014-04-12: 1000 mL via INTRAVENOUS

## 2014-04-11 MED ORDER — LEVOFLOXACIN IN D5W 750 MG/150ML IV SOLN
750.0000 mg | Freq: Once | INTRAVENOUS | Status: AC
  Administered 2014-04-11: 750 mg via INTRAVENOUS
  Filled 2014-04-11: qty 150

## 2014-04-11 MED ORDER — SODIUM CHLORIDE 0.9 % IV BOLUS (SEPSIS)
1000.0000 mL | Freq: Once | INTRAVENOUS | Status: AC
  Administered 2014-04-11: 1000 mL via INTRAVENOUS

## 2014-04-11 MED ORDER — TRAZODONE HCL 50 MG PO TABS
50.0000 mg | ORAL_TABLET | Freq: Every evening | ORAL | Status: DC | PRN
  Filled 2014-04-11: qty 1

## 2014-04-11 NOTE — ED Notes (Signed)
Daughter took wallet and meds home with her

## 2014-04-11 NOTE — ED Notes (Signed)
Patient transported to CT 

## 2014-04-11 NOTE — ED Notes (Signed)
EMS-family reports recent diagnosis of pancreatic cancer. Pt with hx of dementia but family reports that pt is has had an decline in mental status in the last couple weeks with today being the worse. Usually is active around house and alert to what is going on. Pt also with noted swelling to lower extremities. CBG 155. Vitals stable en route.

## 2014-04-11 NOTE — ED Notes (Signed)
EMS told me that they saw "bugs" on pt.  I personally did not see any bugs.  Pt was in CT and CT staff saw bugs when moving pt and pt wife is requesting we treat him.  Nix spray which we have is "not for use on humans", pharmacy was called and they had cream.  Will notify EDP

## 2014-04-11 NOTE — ED Notes (Signed)
Asked pt to void in urinal for urine sample

## 2014-04-11 NOTE — ED Provider Notes (Signed)
CSN: [id number]     Arrival date & time 04/11/14  2025 History   First MD Initiated Contact with Patient 04/11/14 2048     Chief Complaint  Patient presents with  . Altered Mental Status     (Consider location/radiation/quality/duration/timing/severity/associated sxs/prior Treatment) Patient is a 68 y.o. male presenting with altered mental status. The history is provided by the patient and a relative.  Altered Mental Status pt here with increased weakness and confusion x 1 week--no fever, emesis--some increased cough, no falls--decreased oral intake and ability to ambulate--sx gradually worsening and nothing makes them better--recently started on gabapentin and dx with pancreatic CA--pt transported by EMS  Past Medical History  Diagnosis Date  . Hypertension   . Hepatitis C   . Dementia   . Pancreatic cancer    No past surgical history on file. No family history on file. History  Substance Use Topics  . Smoking status: Current Every Day Smoker -- 0.50 packs/day for 25 years    Types: Cigarettes  . Smokeless tobacco: Not on file  . Alcohol Use: 3.0 oz/week    5 Cans of beer per week    Review of Systems  All other systems reviewed and are negative.     Allergies  Penicillins  Home Medications   Prior to Admission medications   Medication Sig Start Date End Date Taking? Authorizing Provider  amLODipine (NORVASC) 10 MG tablet Take 1 tablet (10 mg total) by mouth daily. 12/15/11 12/14/12  Samella Parr, NP  hydrochlorothiazide (MICROZIDE) 12.5 MG capsule Take 1 capsule (12.5 mg total) by mouth daily. 12/15/11 12/14/12  Samella Parr, NP   BP 157/73  Pulse 75  Temp(Src) 98.2 F (36.8 C) (Oral)  Resp 18  SpO2 100% Physical Exam  Nursing note and vitals reviewed. Constitutional: He is oriented to person, place, and time. He appears lethargic. He appears cachectic.  Non-toxic appearance. He has a sickly appearance. He appears ill. No distress.  HENT:  Head:  Normocephalic and atraumatic.  Eyes: Conjunctivae, EOM and lids are normal. Pupils are equal, round, and reactive to light.  Neck: Normal range of motion. Neck supple. No tracheal deviation present. No mass present.  Cardiovascular: Normal rate, regular rhythm and normal heart sounds.  Exam reveals no gallop.   No murmur heard. Pulmonary/Chest: Effort normal and breath sounds normal. No stridor. No respiratory distress. He has no decreased breath sounds. He has no wheezes. He has no rhonchi. He has no rales.  Abdominal: Soft. Normal appearance and bowel sounds are normal. He exhibits no distension. There is no tenderness. There is no rebound and no CVA tenderness.  Musculoskeletal: Normal range of motion. He exhibits no edema and no tenderness.  Neurological: He is oriented to person, place, and time. He appears lethargic. No cranial nerve deficit or sensory deficit. GCS eye subscore is 4. GCS verbal subscore is 5. GCS motor subscore is 6.  Bilateral le weakness limited by effort  Skin: Skin is warm and dry. No abrasion and no rash noted.  Psychiatric: His affect is blunt. His speech is delayed. He is slowed.    ED Course  Procedures (including critical care time) Labs Review Labs Reviewed  URINE CULTURE  CBC WITH DIFFERENTIAL  COMPREHENSIVE METABOLIC PANEL  TROPONIN I  URINALYSIS, ROUTINE W REFLEX MICROSCOPIC  CBG MONITORING, ED  I-STAT CG4 LACTIC ACID, ED    Imaging Review No results found.   EKG Interpretation   Date/Time:  Tuesday April 11 2014 20:40:27 EDT  Ventricular Rate:  76 PR Interval:  133 QRS Duration: 89 QT Interval:  352 QTC Calculation: 396 R Axis:   56 Text Interpretation:  Sinus rhythm Ventricular trigeminy Probable left  atrial enlargement Minimal ST depression, inferior leads Confirmed by  Teran Knittle  MD, Darcel Frane ([id number]) on 04/11/2014 9:31:41 PM      MDM   Final diagnoses:  None     Pt started on levaquin and will be admitted to medicine  Leota Jacobsen, MD 04/11/14 2256

## 2014-04-11 NOTE — ED Notes (Addendum)
Pt has large amount of bugs crawling on him.  BUgs are different sizes and appear just as bed bugs do (google image search).  Showed MD.  Also 3 bugs placed in container for further eval in case someone needs to know

## 2014-04-11 NOTE — ED Notes (Signed)
Patient transported to X-ray 

## 2014-04-11 NOTE — H&P (Signed)
[facility] History and Physical  Jahmarion Popoff ACZ:[id number] DOB: [birth year] DOA: 04/11/2014  Referring physician: EDP PCP: Benito Mccreedy, MD   Chief Complaint: AMS   HPI: Joseph Robbins is a 68 y.o. male who is brought in by family for AMS.  He has had increasing weakness and confusion for the past 1 week.  Some increased cough, no fevers nor chills.  Symptoms gradually worsening, decreased PO intake (already emaciated due to pancreatic cancer unfortunately), and decreased ability to ambulate.  Review of Systems: Patient is confused but family members are able to direct him.  Past Medical History  Diagnosis Date  . Hypertension   . Hepatitis C   . Dementia   . Pancreatic cancer    No past surgical history on file. Social History:  reports that he has been smoking Cigarettes.  He has a 12.5 pack-year smoking history. He does not have any smokeless tobacco history on file. He reports that he drinks about 3 ounces of alcohol per week. He reports that he uses illicit drugs (Cocaine and "Crack" cocaine).  Allergies  Allergen Reactions  . Penicillins Anaphylaxis    No family history on file.   Prior to Admission medications   Medication Sig Start Date End Date Taking? Authorizing Provider  amLODipine (NORVASC) 10 MG tablet Take 10 mg by mouth daily.   Yes Historical Provider, MD  cetirizine (ZYRTEC) 10 MG tablet Take 10 mg by mouth daily.   Yes Historical Provider, MD  gabapentin (NEURONTIN) 300 MG capsule Take 300 mg by mouth at bedtime.   Yes Historical Provider, MD  hydrALAZINE (APRESOLINE) 25 MG tablet Take 25 mg by mouth 3 (three) times daily.   Yes Historical Provider, MD  omeprazole (PRILOSEC) 20 MG capsule Take 20 mg by mouth daily.   Yes Historical Provider, MD  traZODone (DESYREL) 50 MG tablet Take 50 mg by mouth at bedtime as needed for sleep.   Yes Historical Provider, MD  HYDROcodone-acetaminophen (NORCO) 7.5-325 MG per tablet Take 1 tablet by mouth 2 (two)  times daily as needed. 03/30/14   Historical Provider, MD  XTANDI 40 MG capsule Take 40 mg by mouth 4 (four) times daily. 04/11/14   Historical Provider, MD   Physical Exam: Filed Vitals:   04/11/14 2042  BP: 157/73  Pulse: 75  Temp: 98.2 F (36.8 C)  Resp: 18    BP 157/73  Pulse 75  Temp(Src) 98.2 F (36.8 C) (Oral)  Resp 18  SpO2 100%  General Appearance:    Confused, cachectic, ill appearing, not really lethargic on my exam although he isnt happy that RN is trying to remove all his clothing right now (due to being infested with bed bugs).  Head:    Normocephalic, atraumatic  Eyes:    PERRL, EOMI, sclera non-icteric        Nose:   Nares without drainage or epistaxis. Mucosa, turbinates normal  Throat:   Moist mucous membranes. Oropharynx without erythema or exudate.  Neck:   Supple. No carotid bruits.  No thyromegaly.  No lymphadenopathy.   Back:     No CVA tenderness, no spinal tenderness  Lungs:     Clear to auscultation bilaterally, without wheezes, rhonchi or rales  Chest wall:    No tenderness to palpitation  Heart:    Regular rate and rhythm without murmurs, gallops, rubs  Abdomen:     Soft, non-tender, nondistended, normal bowel sounds, no organomegaly  Genitalia:    Purulent penile exudate  Rectal:  deferred  Extremities:   No clubbing, cyanosis or edema.  Pulses:   2+ and symmetric all extremities  Skin:   Skin color, texture, turgor normal, no rashes or lesions  Lymph nodes:   Cervical, supraclavicular, and axillary nodes normal  Neurologic:   CNII-XII intact. Normal strength, sensation and reflexes      throughout    Labs on Admission:  Basic Metabolic Panel:  Recent Labs Lab 04/11/14 2053  NA 143  K 4.5  CL 105  CO2 24  GLUCOSE 104*  BUN 35*  CREATININE 1.59*  CALCIUM 8.9   Liver Function Tests:  Recent Labs Lab 04/11/14 2053  AST 69*  ALT 34  ALKPHOS 169*  BILITOT 0.4  PROT 8.3  ALBUMIN 2.8*   No results found for this basename:  LIPASE, AMYLASE,  in the last 168 hours No results found for this basename: AMMONIA,  in the last 168 hours CBC:  Recent Labs Lab 04/11/14 2053  WBC 5.0  NEUTROABS 3.1  HGB 10.7*  HCT 33.9*  MCV 87.8  PLT 263   Cardiac Enzymes:  Recent Labs Lab 04/11/14 2053  TROPONINI <0.30    BNP (last 3 results) No results found for this basename: PROBNP,  in the last 8760 hours CBG:  Recent Labs Lab 04/11/14 2038  GLUCAP 97    Radiological Exams on Admission: Dg Chest 2 View  04/11/2014   CLINICAL DATA:  Pain ; history of pancreatic carcinoma  EXAM: CHEST  2 VIEW  COMPARISON:  December 11, 2011  FINDINGS: There is underlying emphysematous change. There is scarring throughout both lungs, stable. In comparison with the prior study, there is an area of increased opacity in the right middle lobe, probably representing localized pneumonitis. Elsewhere, lungs appear stable compared to prior study.  Heart size is within normal limits. Pulmonary vascularity reflects underlying emphysema. No adenopathy. Bones appear osteoporotic.  IMPRESSION: Area of infiltrate in the right middle lobe. Superimposed lung scarring. Underlying emphysematous change.   Electronically Signed   By: Lowella Grip M.D.   On: 04/11/2014 21:57   Ct Head Wo Contrast  04/11/2014   CLINICAL DATA:  Altered mental status. Recent diagnosis of pancreatic cancer.  EXAM: CT HEAD WITHOUT CONTRAST  TECHNIQUE: Contiguous axial images were obtained from the base of the skull through the vertex without intravenous contrast.  COMPARISON:  CT of the head performed 05/14/2009  FINDINGS: There is no evidence of acute infarction, mass lesion, or intra- or extra-axial hemorrhage on CT.  Prominence of the ventricles and sulci reflects mild cortical volume loss.  The brainstem and fourth ventricle are within normal limits. The basal ganglia are unremarkable in appearance. The cerebral hemispheres demonstrate grossly normal gray-white  differentiation. No mass effect or midline shift is seen.  There is no evidence of fracture; visualized osseous structures are unremarkable in appearance. The orbits are within normal limits. The paranasal sinuses and mastoid air cells are well-aerated. No significant soft tissue abnormalities are seen.  IMPRESSION: 1. No acute intracranial pathology seen on CT. 2. Mild cortical volume loss noted.   Electronically Signed   By: Garald Balding M.D.   On: 04/11/2014 22:36    EKG: Independently reviewed.  Assessment/Plan Principal Problem:   UTI (lower urinary tract infection) Active Problems:   Alcoholism /alcohol abuse   Trichomonas vaginalis infection, male   CAP (community acquired pneumonia)   Infestation by bed bug   Pancreatic cancer   1. CAP - Treating with Levaquin empirically at this  time, blood and sputum cultures pending 2. UTI / Trichomonas - getting levaquin for CAP, however do wish to get an EtOH level before starting metronidazole for his trichomonas (given known history of EtOH abuse in the past), if EtOH negative then can safely begin metronidazole treatment. 3. AMS - due to issue #1 or #2 above 4. Bed bugs - removing clothing and treating with NIX, patient likely will need bath up stairs. 5. Pancreatic cancer - patient was to start [facility] for treatment but will certianly hold off on this for now given acute infectious process(es) as noted above.    Code Status: Full Code for now  Family Communication: Family at bedside Disposition Plan: Admit to inpatient   Time spent: 80 min  [facility] Pager [phone]  If 7AM-7PM, please contact the day team taking care of the patient [email or web address] Password [facility] 04/11/2014, 11:06 PM

## 2014-04-12 ENCOUNTER — Encounter (HOSPITAL_COMMUNITY): Payer: Self-pay | Admitting: *Deleted

## 2014-04-12 ENCOUNTER — Inpatient Hospital Stay (HOSPITAL_COMMUNITY): Payer: Medicare Other

## 2014-04-12 DIAGNOSIS — N179 Acute kidney failure, unspecified: Secondary | ICD-10-CM | POA: Diagnosis present

## 2014-04-12 DIAGNOSIS — R600 Localized edema: Secondary | ICD-10-CM | POA: Diagnosis present

## 2014-04-12 LAB — URINE CULTURE
Colony Count: NO GROWTH
Culture: NO GROWTH

## 2014-04-12 MED ORDER — ENSURE COMPLETE PO LIQD
237.0000 mL | Freq: Two times a day (BID) | ORAL | Status: DC
  Administered 2014-04-12 – 2014-04-14 (×5): 237 mL via ORAL

## 2014-04-12 MED ORDER — FUROSEMIDE 10 MG/ML IJ SOLN
20.0000 mg | Freq: Once | INTRAMUSCULAR | Status: AC
  Administered 2014-04-13: 20 mg via INTRAVENOUS
  Filled 2014-04-12: qty 2

## 2014-04-12 MED ORDER — PERMETHRIN 5 % EX CREA
TOPICAL_CREAM | Freq: Once | CUTANEOUS | Status: AC
  Administered 2014-04-12: 01:00:00 via TOPICAL
  Filled 2014-04-12: qty 60

## 2014-04-12 NOTE — Progress Notes (Signed)
INITIAL NUTRITION ASSESSMENT  DOCUMENTATION CODES Per approved criteria  -Not Applicable   INTERVENTION:  Ensure Complete PO BID, each supplement provides 350 kcal and 13 grams of protein  NUTRITION DIAGNOSIS: Increased nutrient needs related to chronic catabolic illness as evidenced by estimated nutrition needs.   Goal: Intake to meet >90% of estimated nutrition needs.  Monitor:  PO intake, labs, weight trend.  Reason for Assessment: MST  68 y.o. male  Admitting Dx: UTI (lower urinary tract infection)  ASSESSMENT: Patient is a 67 y.o. male who is brought in by family for AMS. He has had increasing weakness and confusion for the past 1 week. Some increased cough, no fevers nor chills. Symptoms gradually worsening, decreased PO intake (already emaciated due to pancreatic cancer unfortunately), and decreased ability to ambulate.  Unable to speak with patient at this time. Per H&P, patient is emaciated due to pancreatic cancer. Noted recent decrease in PO intake. Unsure of usual weight.  Height: Ht Readings from Last 1 Encounters:  04/12/14 6\' 2"  (1.88 m)    Weight: Wt Readings from Last 1 Encounters:  04/12/14 164 lb 11.2 oz (74.707 kg)    Ideal Body Weight: 86.4 kg  % Ideal Body Weight: 86%  Wt Readings from Last 10 Encounters:  04/12/14 164 lb 11.2 oz (74.707 kg)  12/15/11 153 lb 7 oz (69.6 kg)    Usual Body Weight: unknown  % Usual Body Weight: N/A  BMI:  Body mass index is 21.14 kg/(m^2).  Estimated Nutritional Needs: Kcal: 2200-2400 Protein: 120-135 gm Fluid: 2.2-2.4 L  Skin: no wounds  Diet Order: General  EDUCATION NEEDS: -Education not appropriate at this time   Intake/Output Summary (Last 24 hours) at 04/12/14 1433 Last data filed at 04/12/14 1052  Gross per 24 hour  Intake      0 ml  Output    700 ml  Net   -700 ml    Last BM: PTA   Labs:   Recent Labs Lab 04/11/14 2053  NA 143  K 4.5  CL 105  CO2 24  BUN 35*  CREATININE  1.59*  CALCIUM 8.9  GLUCOSE 104*    CBG (last 3)   Recent Labs  04/11/14 2038  GLUCAP 97    Scheduled Meds: . amLODipine  10 mg Oral Daily  . gabapentin  300 mg Oral QHS  . heparin  5,000 Units Subcutaneous 3 times per day  . hydrALAZINE  25 mg Oral TID  . [START ON 04/13/2014] levofloxacin (LEVAQUIN) IV  750 mg Intravenous Q48H  . loratadine  10 mg Oral Daily  . pantoprazole  40 mg Oral Daily    Continuous Infusions: . sodium chloride 1,000 mL (04/12/14 1112)    Past Medical History  Diagnosis Date  . Hypertension   . Hepatitis C   . Dementia   . Pancreatic cancer     History reviewed. No pertinent past surgical history.   Molli Barrows, RD, LDN, [place] Pager [phone] After Hours Pager [phone]

## 2014-04-12 NOTE — Progress Notes (Signed)
Utilization review completed.  

## 2014-04-12 NOTE — Progress Notes (Signed)
[facility] PROGRESS NOTE  Joseph Robbins FTD:[id number] DOB: 12-24-1945 DOA: 04/11/2014 PCP: Benito Mccreedy, MD  Brief narrative I have seen and examined patient at bedside and reviewed his chart. Joseph Robbins is a 68 y.o. male with dementia, pancreatic CA, alcoholism, among other medical problems, who was brought in by family for AMS. He has had increasing weakness and confusion for a week or two before admission, associated with increased cough, without fevers or chills. Needless to say, patient was found to have community-acquired pneumonia and UTI which probably led to the encephalopathy. He also has lower extremity swelling of unclear etiology. We'll continue current antibiotics and follow septic workup. Also will obtain renal ultrasound and 2-D echocardiogram. I spoke with the patient's sister [phone]) and updated her on the patient's current condition. Sister believes that patient is not able to by himself at home as she is afraid he may be wandering. Will ask physical therapy to assess and work with case management regarding disposition. Plan UTI (lower urinary tract infection)/CAP (community acquired pneumonia)/Trichomonas vaginalis infection, male//AKI (acute kidney injury)/ Infestation by bed bug  Follow urine culture  Renal ultrasound  Discontinue IV fluids as patient tolerating feeding Alcoholism /alcohol abuse  Watch for DTs  Folic acid  Thiamine Pancreatic cancer/Pedal edema  Give Lasix dose  2-D echo  DVT/GI prophylaxis  Heparin subcutaneous  PPI  Code Status: Full Code Family Communication: With sister Rippie Disposition Plan: ?SNF   Consultants:  None  Procedures:  None  Antibiotics:  Levaquin 04/11/14>  HPI/Subjective: Feels okay. No complaints.  Objective: Filed Vitals:   04/12/14 0439  BP: 130/68  Pulse: 78  Temp: 97.6 F (36.4 C)  Resp: 18    Intake/Output Summary (Last 24 hours) at 04/12/14 1723 Last data filed  at 04/12/14 1052  Gross per 24 hour  Intake      0 ml  Output    700 ml  Net   -700 ml   Filed Weights   04/12/14 0024  Weight: 74.707 kg (164 lb 11.2 oz)    Exam:   General:  Sitting up in chair. Not in distress  Cardiovascular: RRR. S1-S2 normal. No murmurs  Respiratory: Lungs clear  Abdomen: Soft and nontender. Positive bowel sound. No palpable organomegaly  Musculoskeletal: Bilateral pitting pedal edema  Data Reviewed: Basic Metabolic Panel:  Recent Labs Lab 04/11/14 2053  NA 143  K 4.5  CL 105  CO2 24  GLUCOSE 104*  BUN 35*  CREATININE 1.59*  CALCIUM 8.9   Liver Function Tests:  Recent Labs Lab 04/11/14 2053  AST 69*  ALT 34  ALKPHOS 169*  BILITOT 0.4  PROT 8.3  ALBUMIN 2.8*   No results found for this basename: LIPASE, AMYLASE,  in the last 168 hours No results found for this basename: AMMONIA,  in the last 168 hours CBC:  Recent Labs Lab 04/11/14 2053  WBC 5.0  NEUTROABS 3.1  HGB 10.7*  HCT 33.9*  MCV 87.8  PLT 263   Cardiac Enzymes:  Recent Labs Lab 04/11/14 2053  TROPONINI <0.30   BNP (last 3 results) No results found for this basename: PROBNP,  in the last 8760 hours CBG:  Recent Labs Lab 04/11/14 2038  GLUCAP 97    No results found for this or any previous visit (from the past 240 hour(s)).   Studies: Dg Chest 2 View  04/11/2014   CLINICAL DATA:  Pain ; history of pancreatic carcinoma  EXAM: CHEST  2 VIEW  COMPARISON:  December 11, 2011  FINDINGS: There is underlying emphysematous change. There is scarring throughout both lungs, stable. In comparison with the prior study, there is an area of increased opacity in the right middle lobe, probably representing localized pneumonitis. Elsewhere, lungs appear stable compared to prior study.  Heart size is within normal limits. Pulmonary vascularity reflects underlying emphysema. No adenopathy. Bones appear osteoporotic.  IMPRESSION: Area of infiltrate in the right middle lobe.  Superimposed lung scarring. Underlying emphysematous change.   Electronically Signed   By: Lowella Grip M.D.   On: 04/11/2014 21:57   Ct Head Wo Contrast  04/11/2014   CLINICAL DATA:  Altered mental status. Recent diagnosis of pancreatic cancer.  EXAM: CT HEAD WITHOUT CONTRAST  TECHNIQUE: Contiguous axial images were obtained from the base of the skull through the vertex without intravenous contrast.  COMPARISON:  CT of the head performed 05/14/2009  FINDINGS: There is no evidence of acute infarction, mass lesion, or intra- or extra-axial hemorrhage on CT.  Prominence of the ventricles and sulci reflects mild cortical volume loss.  The brainstem and fourth ventricle are within normal limits. The basal ganglia are unremarkable in appearance. The cerebral hemispheres demonstrate grossly normal gray-white differentiation. No mass effect or midline shift is seen.  There is no evidence of fracture; visualized osseous structures are unremarkable in appearance. The orbits are within normal limits. The paranasal sinuses and mastoid air cells are well-aerated. No significant soft tissue abnormalities are seen.  IMPRESSION: 1. No acute intracranial pathology seen on CT. 2. Mild cortical volume loss noted.   Electronically Signed   By: Garald Balding M.D.   On: 04/11/2014 22:36    Scheduled Meds: . amLODipine  10 mg Oral Daily  . feeding supplement (ENSURE COMPLETE)  237 mL Oral BID BM  . furosemide  20 mg Intravenous Once  . gabapentin  300 mg Oral QHS  . heparin  5,000 Units Subcutaneous 3 times per day  . hydrALAZINE  25 mg Oral TID  . [START ON 04/13/2014] levofloxacin (LEVAQUIN) IV  750 mg Intravenous Q48H  . loratadine  10 mg Oral Daily  . pantoprazole  40 mg Oral Daily   Continuous Infusions:     [facility] Pager [phone]. If 7PM-7AM, please contact night-coverage at [email or web address], password [facility] 04/12/2014, 5:23 PM  LOS: 1 day

## 2014-04-13 DIAGNOSIS — I519 Heart disease, unspecified: Secondary | ICD-10-CM

## 2014-04-13 DIAGNOSIS — N281 Cyst of kidney, acquired: Secondary | ICD-10-CM

## 2014-04-13 LAB — COMPREHENSIVE METABOLIC PANEL
ALK PHOS: 134 U/L — AB (ref 39–117)
ALT: 27 U/L (ref 0–53)
AST: 49 U/L — ABNORMAL HIGH (ref 0–37)
Albumin: 2.3 g/dL — ABNORMAL LOW (ref 3.5–5.2)
BILIRUBIN TOTAL: 0.5 mg/dL (ref 0.3–1.2)
BUN: 28 mg/dL — AB (ref 6–23)
CHLORIDE: 103 meq/L (ref 96–112)
CO2: 19 meq/L (ref 19–32)
Calcium: 8.6 mg/dL (ref 8.4–10.5)
Creatinine, Ser: 1.31 mg/dL (ref 0.50–1.35)
GFR, EST AFRICAN AMERICAN: 62 mL/min — AB (ref >90)
GFR, EST NON AFRICAN AMERICAN: 54 mL/min — AB (ref >90)
Glucose, Bld: 87 mg/dL (ref 70–99)
Potassium: 4.4 mEq/L (ref 3.7–5.3)
Sodium: 140 mEq/L (ref 137–147)
Total Protein: 7.2 g/dL (ref 6.0–8.3)

## 2014-04-13 LAB — CBC
HCT: 31.4 % — ABNORMAL LOW (ref 39.0–52.0)
HEMOGLOBIN: 9.9 g/dL — AB (ref 13.0–17.0)
MCH: 27.4 pg (ref 26.0–34.0)
MCHC: 31.5 g/dL (ref 30.0–36.0)
MCV: 87 fL (ref 78.0–100.0)
Platelets: 236 10*3/uL (ref 150–400)
RBC: 3.61 MIL/uL — ABNORMAL LOW (ref 4.22–5.81)
RDW: 13.5 % (ref 11.5–15.5)
WBC: 5.1 10*3/uL (ref 4.0–10.5)

## 2014-04-13 LAB — PRO B NATRIURETIC PEPTIDE: Pro B Natriuretic peptide (BNP): 1697 pg/mL — ABNORMAL HIGH (ref 0–125)

## 2014-04-13 LAB — MAGNESIUM: MAGNESIUM: 1.7 mg/dL (ref 1.5–2.5)

## 2014-04-13 LAB — PHOSPHORUS: Phosphorus: 3.2 mg/dL (ref 2.3–4.6)

## 2014-04-13 MED ORDER — FUROSEMIDE 20 MG PO TABS
20.0000 mg | ORAL_TABLET | Freq: Every day | ORAL | Status: DC
  Administered 2014-04-13 – 2014-04-14 (×2): 20 mg via ORAL
  Filled 2014-04-13 (×2): qty 1

## 2014-04-13 MED ORDER — LEVOFLOXACIN IN D5W 750 MG/150ML IV SOLN
750.0000 mg | Freq: Once | INTRAVENOUS | Status: AC
  Administered 2014-04-13: 750 mg via INTRAVENOUS
  Filled 2014-04-13: qty 150

## 2014-04-13 MED ORDER — LEVOFLOXACIN 750 MG PO TABS: 750.0000 mg | ORAL_TABLET | ORAL | Status: DC

## 2014-04-13 NOTE — Progress Notes (Signed)
[facility] PROGRESS NOTE  Aahil Fredin PJK:[id number] DOB: [birth year] DOA: 04/11/2014 PCP: Benito Mccreedy, MD  Brief Narrative Joseph Robbins is a 68 y.o. male with dementia, pancreatic CA, alcoholism, among other medical problems, who was brought in by family for AMS. He had increasing weakness and confusion for a week or two before admission, associated with increased cough, without fevers or chills. Needless to say, patient was found to have community-acquired pneumonia and UTI which probably led to the encephalopathy. He also has lower extremity swelling of unclear etiology. We'll continue current antibiotics and follow septic workup. Also will obtain renal ultrasound and 2-D echocardiogram. I spoke with the patient's sister [phone]) and updated her on the patient's current condition. Sister believes that patient is not able to care for himself at home as she is afraid he may be wandering. Await physical therapy to assess and work with case management regarding disposition.  Plan  UTI (lower urinary tract infection)/CAP (community acquired pneumonia)/Trichomonas vaginalis infection, male//AKI (acute kidney injury)/ Infestation by bed bug/simple renal cysts  Urine culture shows no growth to date Renal ultrasound showed simple renal cysts with no hydronephrosis. Spoke with Dr. Ottelin(Urology) felt that the cysts are benign and can be left alone. Continue antibiotics to complete 7 days.  Alcoholism /alcohol abuse  Watch for DTs  Folic acid  Thiamine Pancreatic cancer/Pedal edema  Schedule low dose Lasix Await 2-D echo  DVT/GI prophylaxis  Heparin subcutaneous PPI  Code Status: Full Code  Family Communication: With sister Rippie over the phone yesterday(04/12/14) Disposition Plan: ?SNF  Consultants:  None Procedures:  None Antibiotics:  Levaquin 04/11/14>  HPI/Subjective: Has no complaints  Objective: Filed Vitals:   04/13/14 0548  BP: 134/62  Pulse: 80   Temp: 97.9 F (36.6 C)  Resp: 22    Intake/Output Summary (Last 24 hours) at 04/13/14 0855 Last data filed at 04/13/14 0326  Gross per 24 hour  Intake      0 ml  Output   1400 ml  Net  -1400 ml   Filed Weights   04/12/14 0024  Weight: 74.707 kg (164 lb 11.2 oz)    Exam:  General: Sitting up in chair. Not in distress  Cardiovascular: RRR. S1-S2 normal. No murmurs  Respiratory: Lungs clear  Abdomen: Soft and nontender. Positive bowel sound. No palpable organomegaly  Musculoskeletal: Bilateral pitting pedal edema  Data Reviewed: Basic Metabolic Panel:  Recent Labs Lab 04/11/14 2053  NA 143  K 4.5  CL 105  CO2 24  GLUCOSE 104*  BUN 35*  CREATININE 1.59*  CALCIUM 8.9   Liver Function Tests:  Recent Labs Lab 04/11/14 2053  AST 69*  ALT 34  ALKPHOS 169*  BILITOT 0.4  PROT 8.3  ALBUMIN 2.8*   No results found for this basename: LIPASE, AMYLASE,  in the last 168 hours No results found for this basename: AMMONIA,  in the last 168 hours CBC:  Recent Labs Lab 04/11/14 2053 04/13/14 0710  WBC 5.0 5.1  NEUTROABS 3.1  --   HGB 10.7* 9.9*  HCT 33.9* 31.4*  MCV 87.8 87.0  PLT 263 236   Cardiac Enzymes:  Recent Labs Lab 04/11/14 2053  TROPONINI <0.30   BNP (last 3 results) No results found for this basename: PROBNP,  in the last 8760 hours CBG:  Recent Labs Lab 04/11/14 2038  GLUCAP 97    Recent Results (from the past 240 hour(s))  URINE CULTURE     Status: None   Collection Time  04/11/14  8:59 PM      Result Value Ref Range Status   Specimen Description URINE, RANDOM   Final   Special Requests NONE   Final   Culture  Setup Time     Final   Value: 04/12/2014 01:01     Performed at [place]     Final   Value: NO GROWTH     Performed at [facility]   Culture     Final   Value: NO GROWTH     Performed at [facility]   Report Status 04/12/2014 FINAL   Final  CULTURE, BLOOD (ROUTINE X 2)      Status: None   Collection Time    04/12/14  1:14 AM      Result Value Ref Range Status   Specimen Description BLOOD RIGHT ANTECUBITAL   Final   Special Requests BOTTLES DRAWN AEROBIC AND ANAEROBIC 10CC   Final   Culture  Setup Time     Final   Value: 04/12/2014 07:59     Performed at [facility]   Culture     Final   Value:        BLOOD CULTURE RECEIVED NO GROWTH TO DATE CULTURE WILL BE HELD FOR 5 DAYS BEFORE ISSUING A FINAL NEGATIVE REPORT     Performed at [facility]   Report Status PENDING   Incomplete  CULTURE, BLOOD (ROUTINE X 2)     Status: None   Collection Time    04/12/14  1:19 AM      Result Value Ref Range Status   Specimen Description BLOOD RIGHT HAND   Final   Special Requests BOTTLES DRAWN AEROBIC ONLY 1CC   Final   Culture  Setup Time     Final   Value: 04/12/2014 07:59     Performed at [facility]   Culture     Final   Value:        BLOOD CULTURE RECEIVED NO GROWTH TO DATE CULTURE WILL BE HELD FOR 5 DAYS BEFORE ISSUING A FINAL NEGATIVE REPORT     Performed at [facility]   Report Status PENDING   Incomplete     Studies: Dg Chest 2 View  04/11/2014   CLINICAL DATA:  Pain ; history of pancreatic carcinoma  EXAM: CHEST  2 VIEW  COMPARISON:  December 11, 2011  FINDINGS: There is underlying emphysematous change. There is scarring throughout both lungs, stable. In comparison with the prior study, there is an area of increased opacity in the right middle lobe, probably representing localized pneumonitis. Elsewhere, lungs appear stable compared to prior study.  Heart size is within normal limits. Pulmonary vascularity reflects underlying emphysema. No adenopathy. Bones appear osteoporotic.  IMPRESSION: Area of infiltrate in the right middle lobe. Superimposed lung scarring. Underlying emphysematous change.   Electronically Signed   By: Lowella Grip M.D.   On: 04/11/2014 21:57   Ct Head Wo Contrast  04/11/2014   CLINICAL DATA:  Altered  mental status. Recent diagnosis of pancreatic cancer.  EXAM: CT HEAD WITHOUT CONTRAST  TECHNIQUE: Contiguous axial images were obtained from the base of the skull through the vertex without intravenous contrast.  COMPARISON:  CT of the head performed 05/14/2009  FINDINGS: There is no evidence of acute infarction, mass lesion, or intra- or extra-axial hemorrhage on CT.  Prominence of the ventricles and sulci reflects mild cortical volume loss.  The brainstem and fourth  ventricle are within normal limits. The basal ganglia are unremarkable in appearance. The cerebral hemispheres demonstrate grossly normal gray-white differentiation. No mass effect or midline shift is seen.  There is no evidence of fracture; visualized osseous structures are unremarkable in appearance. The orbits are within normal limits. The paranasal sinuses and mastoid air cells are well-aerated. No significant soft tissue abnormalities are seen.  IMPRESSION: 1. No acute intracranial pathology seen on CT. 2. Mild cortical volume loss noted.   Electronically Signed   By: Garald Balding M.D.   On: 04/11/2014 22:36   US Renal  04/13/2014   CLINICAL DATA:  Acute renal insufficiency.  EXAM: RENAL/URINARY TRACT ULTRASOUND COMPLETE  COMPARISON:  CT of the abdomen and pelvis performed 04/30/2012  FINDINGS: Right Kidney:  Length: 11.0 cm. Diffusely increased parenchymal echogenicity and cortical thinning noted. A 2.8 x 2.5 x 2.4 cm cyst is noted at the lower pole of the right kidney. No hydronephrosis visualized.  Left Kidney:  Length: 12.5 cm. Diffusely increased parenchymal echogenicity and cortical thinning noted. Two cysts are noted at the left kidney, measuring 6.3 x 5.7 x 4.5 cm at the interpole region of the left kidney, and 10.0 x 9.9 x 9.6 cm at the lower pole of the left kidney. No hydronephrosis visualized.  Bladder:  Appears normal for degree of bladder distention.  Note is made of a trace right pleural effusion.  IMPRESSION: 1. Diffusely  increased renal parenchymal echogenicity raises concern for medical renal disease. 2. Renal cortical thinning likely reflects underlying chronic renal disease. 3. Bilateral renal cysts seen, measuring up to 10.0 cm at the left kidney. These appear new from 2013.   Electronically Signed   By: Garald Balding M.D.   On: 04/13/2014 03:15    Scheduled Meds: . amLODipine  10 mg Oral Daily  . feeding supplement (ENSURE COMPLETE)  237 mL Oral BID BM  . furosemide  20 mg Oral Daily  . gabapentin  300 mg Oral QHS  . heparin  5,000 Units Subcutaneous 3 times per day  . hydrALAZINE  25 mg Oral TID  . levofloxacin (LEVAQUIN) IV  750 mg Intravenous Q48H  . loratadine  10 mg Oral Daily  . pantoprazole  40 mg Oral Daily     Oliviah Agostini  [facility] Pager [phone]. If 7PM-7AM, please contact night-coverage at [email or web address], password [facility] 04/13/2014, 8:55 AM  LOS: 2 days

## 2014-04-13 NOTE — Progress Notes (Addendum)
Clinical Social Work Department CLINICAL SOCIAL WORK PLACEMENT NOTE 04/13/2014  Patient:  Joseph Robbins, Joseph Robbins  Account Number:  [id number] Admit date:  04/11/2014  Clinical Social Worker:  Megan Salon  Date/time:  04/13/2014 02:30 PM  Clinical Social Work is seeking post-discharge placement for this patient at the following level of care:   SKILLED NURSING   (*CSW will update this form in Epic as items are completed)   04/13/2014  Patient/family provided with [place] Department of Clinical Social Work's list of facilities offering this level of care within the geographic area requested by the patient (or if unable, by the patient's family).  04/13/2014  Patient/family informed of their freedom to choose among providers that offer the needed level of care, that participate in Medicare, Medicaid or managed care program needed by the patient, have an available bed and are willing to accept the patient.  04/13/2014  Patient/family informed of [facility]' ownership interest in [facility], as well as of the fact that they are under no obligation to receive care at this facility.  PASARR submitted to EDS on 04/13/2014 PASARR number received from EDS on 04/13/14  FL2 transmitted to all facilities in geographic area requested by pt/family on  04/13/2014 FL2 transmitted to all facilities within larger geographic area on   Patient informed that his/her managed care company has contracts with or will negotiate with  certain facilities, including the following:     Patient/family informed of bed offers received:   Patient chooses bed at [facility] Physician recommends and patient chooses bed at    Patient to be transferred to  on  04/14/2014 Patient to be transferred to facility by [facility]  The following physician request were entered in Epic:   Additional Comments:   Jeanette Caprice, MSW, [place]

## 2014-04-13 NOTE — Evaluation (Signed)
Physical Therapy Evaluation Patient Details Name: Joseph Robbins MRN: [id number] DOB: 06-05-1946 Today's Date: 04/13/2014   History of Present Illness  Pt is a 68yo with hx of alcoholism, pancreatic CA, dementia who lives alone and was found by his dgtr to be weak and confused. Pt with UTI and CAP as well as bug infestation on admission.  Clinical Impression  Pt pleasant but confused and not aware of place or date. Pt with difficulty and delay sequencing all transfers and mobility with cueing and assist. Dgtr present throughout session and also educated for deficits and plan. Pt with AMS, weakness, impaired balance and function who will benefit from acute therapy to maximize mobility, function, strength and safety and continue therapy at SNF on DC. Recommend daily mobility OOB with staff.    Follow Up Recommendations SNF;Supervision/Assistance - 24 hour    Equipment Recommendations  Rolling walker with 5" wheels    Recommendations for Other Services OT consult     Precautions / Restrictions Precautions Precautions: Fall Precaution Comments: contact-including hair and shoe covers      Mobility  Bed Mobility Overal bed mobility: Needs Assistance Bed Mobility: Rolling;Sidelying to Sit Rolling: Mod assist Sidelying to sit: Mod assist       General bed mobility comments: cues for sequence, use of rail and assist to roll, bring legs off bed and elevate trunk  Transfers Overall transfer level: Needs assistance   Transfers: Sit to/from Stand Sit to Stand: Min assist         General transfer comment: assist to stand from bed and toilet with use of rail and assist to lower and control descent to surface with max cues  Ambulation/Gait Ambulation/Gait assistance: Min assist Ambulation Distance (Feet): 15 Feet (15' x 2 trials with seated rest) Assistive device: None Gait Pattern/deviations: Step-through pattern;Decreased stride length;Shuffle   Gait velocity interpretation:  <1.8 ft/sec, indicative of risk for recurrent falls General Gait Details: cues for posture and to look up   Stairs            Wheelchair Mobility    Modified Rankin (Stroke Patients Only)       Balance Overall balance assessment: Needs assistance   Sitting balance-Leahy Scale: Fair       Standing balance-Leahy Scale: Fair                               Pertinent Vitals/Pain No pain    Home Living Family/patient expects to be discharged to:: Private residence Living Arrangements: Alone Available Help at Discharge: Family;Available PRN/intermittently Type of Home: House Home Access: Stairs to enter Entrance Stairs-Rails: Right Entrance Stairs-Number of Steps: 5 Home Layout: One level Home Equipment: Cane - single point      Prior Function Level of Independence: Independent         Comments: pt and dgtr report pt was living alone and caring for himself PTA but that house is in complete disarray     Hand Dominance        Extremity/Trunk Assessment   Upper Extremity Assessment: Generalized weakness           Lower Extremity Assessment: Generalized weakness;RLE deficits/detail;LLE deficits/detail RLE Deficits / Details: bil LE hip flexion, knee flexion and extension all 2+/5 LLE Deficits / Details: bil LE hip flexion, knee flexion and extension all 2+/5  Cervical / Trunk Assessment: Normal  Communication   Communication: No difficulties  Cognition Arousal/Alertness: Awake/alert Behavior During Therapy: Flat  affect Overall Cognitive Status: Impaired/Different from baseline Area of Impairment: Orientation;Memory;Attention;Safety/judgement;Awareness;Problem solving Orientation Level: Disoriented to;Time;Place;Situation Current Attention Level: Focused Memory: Decreased short-term memory   Safety/Judgement: Decreased awareness of deficits;Decreased awareness of safety   Problem Solving: Slow processing;Decreased initiation;Difficulty  sequencing;Requires verbal cues;Requires tactile cues      General Comments      Exercises General Exercises - Lower Extremity Hip Flexion/Marching: AAROM;Seated;Both;10 reps      Assessment/Plan    PT Assessment Patient needs continued PT services  PT Diagnosis Difficulty walking;Altered mental status;Generalized weakness   PT Problem List Decreased strength;Decreased cognition;Decreased activity tolerance;Decreased balance;Decreased mobility;Decreased coordination;Decreased safety awareness;Decreased knowledge of use of DME  PT Treatment Interventions Gait training;Stair training;Functional mobility training;Therapeutic activities;Therapeutic exercise;Patient/family education;Cognitive remediation;DME instruction;Balance training   PT Goals (Current goals can be found in the Care Plan section) Acute Rehab PT Goals Patient Stated Goal: dgtr would like pt to be able to get up on his own PT Goal Formulation: With family Time For Goal Achievement: 04/27/14 Potential to Achieve Goals: Fair    Frequency Min 3X/week   Barriers to discharge Decreased caregiver support      Co-evaluation               End of Session Equipment Utilized During Treatment: Gait belt Activity Tolerance: Patient limited by fatigue Patient left: in chair;with call bell/phone within reach;with chair alarm set;with family/visitor present Nurse Communication: Mobility status         Time: 1829-9371 PT Time Calculation (min): 23 min   Charges:   PT Evaluation $Initial PT Evaluation Tier I: 1 Procedure PT Treatments $Therapeutic Activity: 8-22 mins   PT G Codes:          Trany Chernick B Rithy Mandley 04/13/2014, 10:16 AM Elwyn Reach, [place]

## 2014-04-13 NOTE — Progress Notes (Signed)
Clinical Social Work Department BRIEF PSYCHOSOCIAL ASSESSMENT 04/13/2014  Patient:  Joseph Robbins, Joseph Robbins     Account Number:  [id number]     Admit date:  04/11/2014  Clinical Social Worker:  Megan Salon  Date/Time:  04/13/2014 02:22 PM  Referred by:  Care Management  Date Referred:  04/13/2014 Referred for  SNF Placement   Other Referral:   Interview type:  Family Other interview type:   CSW spoke to patient's daughter over the phone    PSYCHOSOCIAL DATA Living Status:  ALONE Admitted from facility:   Level of care:   Primary support name:  Mariana Kaufman Primary support relationship to patient:  CHILD, ADULT Degree of support available:   Adequate    CURRENT CONCERNS Current Concerns  Post-Acute Placement   Other Concerns:    SOCIAL WORK ASSESSMENT / PLAN Clinical Social Worker received referral for SNF placement at d/c. Patient only oriented to self. CSW reviewed chart and called patient's daughter. CSW introduced self and explained reason for call. CSW explained SNF process to patient's daughter.Patient's daughter reported she is agreeable for SNF placement but would eventually like long term care.  Patient's daughter states she would like [facility] for long term. CSW explained that PT recommended some rehab first at a skilled nursing facility and then the social workers at the facility could help daughter transition into long term care. CSW will complete FL2 for MD's signature and will update family when bed offers are received.   Assessment/plan status:  Psychosocial Support/Ongoing Assessment of Needs Other assessment/ plan:   Information/referral to community resources:   CSW information    PATIENT'S/FAMILY'S RESPONSE TO PLAN OF CARE: Patient's daughter would like patient to go into [place] eventually where patient's family member lives. CSW explained that after short term rehab, the snf can help family transition patient into long term care.  Patient's daughter verbalized understanding and thanked Education officer, museum for calling.        Jeanette Caprice, MSW, [place]

## 2014-04-13 NOTE — Progress Notes (Signed)
  Echocardiogram 2D Echocardiogram has been performed.  Donata Clay 04/13/2014, 3:58 PM

## 2014-04-14 LAB — BASIC METABOLIC PANEL
BUN: 23 mg/dL (ref 6–23)
CALCIUM: 8.5 mg/dL (ref 8.4–10.5)
CHLORIDE: 104 meq/L (ref 96–112)
CO2: 24 meq/L (ref 19–32)
Creatinine, Ser: 1.31 mg/dL (ref 0.50–1.35)
GFR calc Af Amer: 62 mL/min — ABNORMAL LOW (ref >90)
GFR calc non Af Amer: 54 mL/min — ABNORMAL LOW (ref >90)
Glucose, Bld: 91 mg/dL (ref 70–99)
Potassium: 4 mEq/L (ref 3.7–5.3)
SODIUM: 142 meq/L (ref 137–147)

## 2014-04-14 LAB — CBC
HCT: 32.1 % — ABNORMAL LOW (ref 39.0–52.0)
Hemoglobin: 10.1 g/dL — ABNORMAL LOW (ref 13.0–17.0)
MCH: 27.2 pg (ref 26.0–34.0)
MCHC: 31.5 g/dL (ref 30.0–36.0)
MCV: 86.3 fL (ref 78.0–100.0)
PLATELETS: 248 10*3/uL (ref 150–400)
RBC: 3.72 MIL/uL — AB (ref 4.22–5.81)
RDW: 13.4 % (ref 11.5–15.5)
WBC: 4.1 10*3/uL (ref 4.0–10.5)

## 2014-04-14 MED ORDER — POTASSIUM CHLORIDE ER 10 MEQ PO TBCR: 10.0000 meq | EXTENDED_RELEASE_TABLET | Freq: Every day | ORAL | Status: AC

## 2014-04-14 MED ORDER — ENSURE COMPLETE PO LIQD: 237.0000 mL | Freq: Two times a day (BID) | ORAL | Status: DC

## 2014-04-14 MED ORDER — FUROSEMIDE 20 MG PO TABS: 20.0000 mg | ORAL_TABLET | Freq: Every day | ORAL | Status: AC

## 2014-04-14 MED ORDER — ASPIRIN EC 81 MG PO TBEC: 81.0000 mg | DELAYED_RELEASE_TABLET | Freq: Every day | ORAL | Status: AC

## 2014-04-14 MED ORDER — FOLIC ACID 1 MG PO TABS: 1.0000 mg | ORAL_TABLET | Freq: Every day | ORAL | Status: AC

## 2014-04-14 MED ORDER — LEVOFLOXACIN 750 MG PO TABS: 750.0000 mg | ORAL_TABLET | Freq: Every day | ORAL | Status: DC

## 2014-04-14 MED ORDER — VITAMIN B-1 100 MG PO TABS: 100.0000 mg | ORAL_TABLET | Freq: Every day | ORAL | Status: AC

## 2014-04-14 MED ORDER — HYDROCODONE-ACETAMINOPHEN 7.5-325 MG PO TABS: 1.0000 | ORAL_TABLET | Freq: Two times a day (BID) | ORAL | Status: DC | PRN

## 2014-04-14 NOTE — Progress Notes (Signed)
Pt prepared for d/c to SNF. IV d/c'd. Skin intact except as most recently charted. Vitals are stable. Report called to receiving facility. Pt to be transported by ambulance service. 

## 2014-04-14 NOTE — Discharge Summary (Signed)
Joseph Robbins, is a 68 y.o. male  DOB 11/05/1946  MRN [id number].  Admission date:  04/11/2014  Admitting Physician  Etta Quill, DO  Discharge Date:  04/14/2014   Primary MD  Benito Mccreedy, MD  Recommendations for primary care physician for things to follow:   Please Follow renal function.   Admission Diagnosis  Infestation by bed bug [134.8] Trichomonas vaginalis infection, male [131.03] Pancreatic cancer [157.9] Alcoholism /alcohol abuse [303.90] UTI (lower urinary tract infection) [599.0] Community acquired pneumonia [486] CAP (community acquired pneumonia) [486]   Discharge Diagnosis  Infestation by bed bug [134.8] Trichomonas vaginalis infection, male [131.03] Pancreatic cancer [157.9] Alcoholism /alcohol abuse [303.90] UTI (lower urinary tract infection) [599.0] Community acquired pneumonia [486] CAP (community acquired pneumonia) [486]    Active Problems:   Alcoholism /alcohol abuse   Pancreatic cancer   Pedal edema   Kidney cysts      Past Medical History  Diagnosis Date  . Hypertension   . Hepatitis C   . Dementia   . Pancreatic cancer     History reviewed. No pertinent past surgical history.   Discharge Condition: Stable   Discharge Instructions  and  Discharge Medications        Discharge Orders   Future Orders Complete By Expires   Diet - low sodium heart healthy  As directed    Increase activity slowly  As directed        Medication List         amLODipine 10 MG tablet  Commonly known as:  NORVASC  Take 10 mg by mouth daily.     aspirin EC 81 MG tablet  Take 1 tablet (81 mg total) by mouth daily.     cetirizine 10 MG tablet  Commonly known as:  ZYRTEC  Take 10 mg by mouth daily.     feeding supplement (ENSURE COMPLETE) Liqd  Take 237 mLs by mouth 2 (two) times  daily between meals.     folic acid 1 MG tablet  Commonly known as:  FOLVITE  Take 1 tablet (1 mg total) by mouth daily.     furosemide 20 MG tablet  Commonly known as:  LASIX  Take 1 tablet (20 mg total) by mouth daily.     gabapentin 300 MG capsule  Commonly known as:  NEURONTIN  Take 300 mg by mouth at bedtime.     hydrALAZINE 25 MG tablet  Commonly known as:  APRESOLINE  Take 25 mg by mouth 3 (three) times daily.     HYDROcodone-acetaminophen 7.5-325 MG per tablet  Commonly known as:  NORCO  Take 1 tablet by mouth 2 (two) times daily as needed.     levofloxacin 750 MG tablet  Commonly known as:  LEVAQUIN  Take 1 tablet (750 mg total) by mouth daily.  Start taking on:  04/15/2014     omeprazole 20 MG capsule  Commonly known as:  PRILOSEC  Take 20 mg by mouth daily.     potassium chloride 10 MEQ tablet  Commonly known as:  K-DUR  Take 1 tablet (10 mEq total) by mouth daily.     thiamine 100 MG tablet  Commonly known as:  VITAMIN B-1  Take 1 tablet (100 mg total) by mouth daily.     traZODone 50 MG tablet  Commonly known as:  DESYREL  Take 50 mg by mouth at bedtime as needed for sleep.     XTANDI 40 MG capsule  Generic drug:  enzalutamide  Take 40 mg by mouth 4 (four) times daily.          Diet and Activity recommendation: See Discharge Instructions above   Consults obtained - None.   Major procedures and Radiology Reports - PLEASE review detailed and final reports for all details, in brief -    Dg Chest 2 View  04/11/2014   CLINICAL DATA:  Pain ; history of pancreatic carcinoma  EXAM: CHEST  2 VIEW  COMPARISON:  December 11, 2011  FINDINGS: There is underlying emphysematous change. There is scarring throughout both lungs, stable. In comparison with the prior study, there is an area of increased opacity in the right middle lobe, probably representing localized pneumonitis. Elsewhere, lungs appear stable compared to prior study.  Heart size is within normal  limits. Pulmonary vascularity reflects underlying emphysema. No adenopathy. Bones appear osteoporotic.  IMPRESSION: Area of infiltrate in the right middle lobe. Superimposed lung scarring. Underlying emphysematous change.   Electronically Signed   By: Lowella Grip M.D.   On: 04/11/2014 21:57   Ct Head Wo Contrast  04/11/2014   CLINICAL DATA:  Altered mental status. Recent diagnosis of pancreatic cancer.  EXAM: CT HEAD WITHOUT CONTRAST  TECHNIQUE: Contiguous axial images were obtained from the base of the skull through the vertex without intravenous contrast.  COMPARISON:  CT of the head performed 05/14/2009  FINDINGS: There is no evidence of acute infarction, mass lesion, or intra- or extra-axial hemorrhage on CT.  Prominence of the ventricles and sulci reflects mild cortical volume loss.  The brainstem and fourth ventricle are within normal limits. The basal ganglia are unremarkable in appearance. The cerebral hemispheres demonstrate grossly normal gray-white differentiation. No mass effect or midline shift is seen.  There is no evidence of fracture; visualized osseous structures are unremarkable in appearance. The orbits are within normal limits. The paranasal sinuses and mastoid air cells are well-aerated. No significant soft tissue abnormalities are seen.  IMPRESSION: 1. No acute intracranial pathology seen on CT. 2. Mild cortical volume loss noted.   Electronically Signed   By: Garald Balding M.D.   On: 04/11/2014 22:36   Nm Bone Scan Whole Body  03/20/2014   CLINICAL DATA:  Lower back pain for 1 week  EXAM: NUCLEAR MEDICINE WHOLE BODY BONE SCAN  TECHNIQUE: Whole body anterior and posterior images were obtained approximately 3 hours after intravenous injection of radiopharmaceutical.  RADIOPHARMACEUTICALS:  27 mCi Technetium-99 MDP  COMPARISON:  CT from 03/20/2014  FINDINGS: Multifocal abnormal areas of increased radiotracer uptake are identified within the axial skeleton compatible with metastatic  disease. There is a large focus of abnormal increased radiotracer uptake involving the left sacral wing and left iliac bone. A medium size focus of intense radiotracer uptake involves the medial aspect of the left acetabulum. There is a small focus of increased uptake identified within the right iliac bone multiple thoracic and lumbar foci are identified as well as bilateral rib lesions. Bilateral scapular lesions are also present. Normal physiologic tracer activity is seen within the kidneys  an urinary bladder.  IMPRESSION: 1. 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 Correlation with plain film radiographs may be helpful.   Electronically Signed   By: Kerby Moors M.D.   On: 03/20/2014 13:54   US Renal  04/13/2014   CLINICAL DATA:  Acute renal insufficiency.  EXAM: RENAL/URINARY TRACT ULTRASOUND COMPLETE  COMPARISON:  CT of the abdomen and pelvis performed 04/30/2012  FINDINGS: Right Kidney:  Length: 11.0 cm. Diffusely increased parenchymal echogenicity and cortical thinning noted. A 2.8 x 2.5 x 2.4 cm cyst is noted at the lower pole of the right kidney. No hydronephrosis visualized.  Left Kidney:  Length: 12.5 cm. Diffusely increased parenchymal echogenicity and cortical thinning noted. Two cysts are noted at the left kidney, measuring 6.3 x 5.7 x 4.5 cm at the interpole region of the left kidney, and 10.0 x 9.9 x 9.6 cm at the lower pole of the left kidney. No hydronephrosis visualized.  Bladder:  Appears normal for degree of bladder distention.  Note is made of a trace right pleural effusion.  IMPRESSION: 1. Diffusely increased renal parenchymal echogenicity raises concern for medical renal disease. 2. Renal cortical thinning likely reflects underlying chronic renal disease. 3. Bilateral renal cysts seen, measuring up to 10.0 cm at the left kidney. These appear new from 2013.   Electronically Signed   By: Garald Balding M.D.   On: 04/13/2014 03:15    Micro  Results    Recent Results (from the past 240 hour(s))  URINE CULTURE     Status: None   Collection Time    04/11/14  8:59 PM      Result Value Ref Range Status   Specimen Description URINE, RANDOM   Final   Special Requests NONE   Final   Culture  Setup Time     Final   Value: 04/12/2014 01:01     Performed at [facility] Count     Final   Value: NO GROWTH     Performed at [facility]   Culture     Final   Value: NO GROWTH     Performed at [facility]   Report Status 04/12/2014 FINAL   Final  CULTURE, BLOOD (ROUTINE X 2)     Status: None   Collection Time    04/12/14  1:14 AM      Result Value Ref Range Status   Specimen Description BLOOD RIGHT ANTECUBITAL   Final   Special Requests BOTTLES DRAWN AEROBIC AND ANAEROBIC 10CC   Final   Culture  Setup Time     Final   Value: 04/12/2014 07:59     Performed at [facility]   Culture     Final   Value:        BLOOD CULTURE RECEIVED NO GROWTH TO DATE CULTURE WILL BE HELD FOR 5 DAYS BEFORE ISSUING A FINAL NEGATIVE REPORT     Performed at [facility]   Report Status PENDING   Incomplete  CULTURE, BLOOD (ROUTINE X 2)     Status: None   Collection Time    04/12/14  1:19 AM      Result Value Ref Range Status   Specimen Description BLOOD RIGHT HAND   Final   Special Requests BOTTLES DRAWN AEROBIC ONLY 1CC   Final   Culture  Setup Time     Final   Value: 04/12/2014 07:59     Performed at  [facility]     Final   Value:        BLOOD CULTURE RECEIVED NO GROWTH TO DATE CULTURE WILL BE HELD FOR 5 DAYS BEFORE ISSUING A FINAL NEGATIVE REPORT     Performed at [facility]   Report Status PENDING   Incomplete     History of present illness and  Hospital Course:     Kindly see H&P for history of present illness and admission details, please review complete Labs, Consult reports and Test reports for all details in brief Patrik Turnbaugh is a 68 y.o. male with  dementia, pancreatic CA, alcoholism, among other medical problems, who was brought in by family for AMS. He had increasing weakness and confusion for a week or two before admission, associated with increased cough, without fevers or chills. Needless to say, patient was found to have community-acquired pneumonia and UTI which probably led to the encephalopathy. He also has lower extremity swelling likely acute to diastolic CHF decompensation. 2-D echocardiogram showed a normal ejection fraction with grade 1 diastolic dysfunction. His septic workup, including urine culture was unrevealing. Renal ultrasound showed simple renal cysts about 10 cm in diameter. I discussed the findings with Dr. Karsten Ro Urology who felt that the cyst could be left alone as they are asymptomatic and uncomplicated. I spoke with the patient's sister Rippie([phone]) and updated her on the patient's current condition. Sister believes that patient is not able to care for himself at home as she is afraid he may be wandering. The patient will be going to rehabilitation facility later today. He should complete the course of Levaquin. He appears to be close to his baseline.  Today   Subjective:   Briggs Edelen today has no headache,no chest abdominal pain,no new weakness tingling or numbness, feels much better wants to go home today.  Objective:   Blood pressure 115/49, pulse 70, temperature 98.2 F (36.8 C), temperature source Oral, resp. rate 18, height 6\' 2"  (1.88 m), weight 74.707 kg (164 lb 11.2 oz), SpO2 99.00%.   Intake/Output Summary (Last 24 hours) at 04/14/14 1329 Last data filed at 04/14/14 0715  Gross per 24 hour  Intake    120 ml  Output    400 ml  Net   -280 ml    Exam Awake Alert, Oriented *3, No new F.N deficits, Normal affect [place].AT,PERRAL Supple Neck,No JVD, No cervical lymphadenopathy appriciated.  Symmetrical Chest wall movement, Good air movement bilaterally, CTAB RRR,No Gallops,Rubs or new  Murmurs, No Parasternal Heave +ve B.Sounds, Abd Soft, Non tender, No organomegaly appriciated, No rebound -guarding or rigidity. No Cyanosis, or Clubbing. Some edema, No new Rash or bruise  Data Review   CBC w Diff: Lab Results  Component Value Date   WBC 4.1 04/14/2014   HGB 10.1* 04/14/2014   HCT 32.1* 04/14/2014   PLT 248 04/14/2014   LYMPHOPCT 23 04/11/2014   MONOPCT 13* 04/11/2014   EOSPCT 2 04/11/2014   BASOPCT 0 04/11/2014    CMP: Lab Results  Component Value Date   NA 142 04/14/2014   K 4.0 04/14/2014   CL 104 04/14/2014   CO2 24 04/14/2014   BUN 23 04/14/2014   CREATININE 1.31 04/14/2014   PROT 7.2 04/13/2014   ALBUMIN 2.3* 04/13/2014   BILITOT 0.5 04/13/2014   ALKPHOS 134* 04/13/2014   AST 49* 04/13/2014   ALT 27 04/13/2014  .   Total Time in preparing paper work, data evaluation and todays exam -  35 minutes  Nat Math M.D on 04/14/2014 at 1:29 PM  [facility] Office  [phone]

## 2014-04-14 NOTE — Progress Notes (Signed)
Clinical social worker assisted with patient discharge to skilled nursing facility, [facility].  CSW addressed all family questions and concerns. CSW copied chart and added all important documents. CSW also set up patient transportation with [employer] operational officer. Clinical Social Worker will sign off for now as social work intervention is no longer needed.   Rhea Pink, MSW, [place]

## 2014-04-14 NOTE — Progress Notes (Signed)
Patient's daughter has accepted a bed at [facility].  Rhea Pink, MSW, [place]

## 2014-04-14 NOTE — Care Management Note (Signed)
    Page 1 of 1   04/14/2014     5:59:27 PM CARE MANAGEMENT NOTE 04/14/2014  Patient:  Joseph Robbins, Joseph Robbins   Account Number:  [id number]  Date Initiated:  04/14/2014  Documentation initiated by:  Tomi Bamberger  Subjective/Objective Assessment:   dx ams  admit- lives alone.     Action/Plan:   pt eval- rec snf.   Anticipated DC Date:  04/14/2014   Anticipated DC Plan:  SKILLED NURSING FACILITY  In-house referral  Clinical Social Worker      DC Planning Services  CM consult      Choice offered to / List presented to:             Status of service:  Completed, signed off Medicare Important Message given?  YES (If response is "NO", the following Medicare IM given date fields will be blank) Date Medicare IM given:  04/11/2014 Date Additional Medicare IM given:  04/14/2014  Discharge Disposition:  [place]  Per UR Regulation:  Reviewed for med. necessity/level of care/duration of stay  If discussed at [place] of Stay Meetings, dates discussed:    Comments:

## 2014-04-18 LAB — CULTURE, BLOOD (ROUTINE X 2)
Culture: NO GROWTH
Culture: NO GROWTH

## 2014-05-19 ENCOUNTER — Emergency Department (HOSPITAL_COMMUNITY): Payer: Medicare Other

## 2014-05-19 ENCOUNTER — Encounter (HOSPITAL_COMMUNITY): Payer: Self-pay | Admitting: Emergency Medicine

## 2014-05-19 ENCOUNTER — Emergency Department (HOSPITAL_COMMUNITY)
Admission: EM | Admit: 2014-05-19 | Discharge: 2014-05-19 | Disposition: A | Discharge status: Home / Self Care | Payer: Medicare Other | Attending: Emergency Medicine | Admitting: Emergency Medicine

## 2014-05-19 DIAGNOSIS — Z7982 Long term (current) use of aspirin: Secondary | ICD-10-CM | POA: Insufficient documentation

## 2014-05-19 DIAGNOSIS — Z880 Allergy status to penicillin: Secondary | ICD-10-CM | POA: Insufficient documentation

## 2014-05-19 DIAGNOSIS — F29 Unspecified psychosis not due to a substance or known physiological condition: Secondary | ICD-10-CM | POA: Insufficient documentation

## 2014-05-19 DIAGNOSIS — F0390 Unspecified dementia without behavioral disturbance: Secondary | ICD-10-CM | POA: Insufficient documentation

## 2014-05-19 DIAGNOSIS — F17200 Nicotine dependence, unspecified, uncomplicated: Secondary | ICD-10-CM | POA: Insufficient documentation

## 2014-05-19 DIAGNOSIS — Z8509 Personal history of malignant neoplasm of other digestive organs: Secondary | ICD-10-CM | POA: Insufficient documentation

## 2014-05-19 DIAGNOSIS — R410 Disorientation, unspecified: Secondary | ICD-10-CM

## 2014-05-19 DIAGNOSIS — R4182 Altered mental status, unspecified: Secondary | ICD-10-CM | POA: Diagnosis present

## 2014-05-19 DIAGNOSIS — I10 Essential (primary) hypertension: Secondary | ICD-10-CM | POA: Insufficient documentation

## 2014-05-19 DIAGNOSIS — Z79899 Other long term (current) drug therapy: Secondary | ICD-10-CM | POA: Diagnosis not present

## 2014-05-19 DIAGNOSIS — Z8619 Personal history of other infectious and parasitic diseases: Secondary | ICD-10-CM | POA: Insufficient documentation

## 2014-05-19 LAB — COMPREHENSIVE METABOLIC PANEL
ALT: 36 U/L (ref 0–53)
AST: 99 U/L — ABNORMAL HIGH (ref 0–37)
Albumin: 2.7 g/dL — ABNORMAL LOW (ref 3.5–5.2)
Alkaline Phosphatase: 251 U/L — ABNORMAL HIGH (ref 39–117)
BUN: 29 mg/dL — ABNORMAL HIGH (ref 6–23)
CO2: 22 meq/L (ref 19–32)
Calcium: 8.9 mg/dL (ref 8.4–10.5)
Chloride: 105 mEq/L (ref 96–112)
Creatinine, Ser: 1.33 mg/dL (ref 0.50–1.35)
GFR, EST AFRICAN AMERICAN: 61 mL/min — AB (ref >90)
GFR, EST NON AFRICAN AMERICAN: 53 mL/min — AB (ref >90)
GLUCOSE: 104 mg/dL — AB (ref 70–99)
Potassium: 4.4 mEq/L (ref 3.7–5.3)
SODIUM: 141 meq/L (ref 137–147)
TOTAL PROTEIN: 7.8 g/dL (ref 6.0–8.3)
Total Bilirubin: 0.5 mg/dL (ref 0.3–1.2)

## 2014-05-19 LAB — URINALYSIS, ROUTINE W REFLEX MICROSCOPIC
BILIRUBIN URINE: NEGATIVE
Glucose, UA: NEGATIVE mg/dL
Hgb urine dipstick: NEGATIVE
Ketones, ur: NEGATIVE mg/dL
LEUKOCYTES UA: NEGATIVE
NITRITE: NEGATIVE
Protein, ur: 100 mg/dL — AB
SPECIFIC GRAVITY, URINE: 1.014 (ref 1.005–1.030)
UROBILINOGEN UA: 1 mg/dL (ref 0.0–1.0)
pH: 5 (ref 5.0–8.0)

## 2014-05-19 LAB — CBC
HEMATOCRIT: 30.1 % — AB (ref 39.0–52.0)
HEMOGLOBIN: 9.3 g/dL — AB (ref 13.0–17.0)
MCH: 26.3 pg (ref 26.0–34.0)
MCHC: 30.9 g/dL (ref 30.0–36.0)
MCV: 85.3 fL (ref 78.0–100.0)
Platelets: 341 10*3/uL (ref 150–400)
RBC: 3.53 MIL/uL — AB (ref 4.22–5.81)
RDW: 15.6 % — ABNORMAL HIGH (ref 11.5–15.5)
WBC: 7.7 10*3/uL (ref 4.0–10.5)

## 2014-05-19 LAB — CBG MONITORING, ED: Glucose-Capillary: 107 mg/dL — ABNORMAL HIGH (ref 70–99)

## 2014-05-19 LAB — URINE MICROSCOPIC-ADD ON

## 2014-05-19 LAB — PROTIME-INR
INR: 1.05 (ref 0.00–1.49)
Prothrombin Time: 13.5 seconds (ref 11.6–15.2)

## 2014-05-19 LAB — ETHANOL: Alcohol, Ethyl (B): <11 mg/dL (ref 0–11)

## 2014-05-19 LAB — AMMONIA: Ammonia: 19 umol/L (ref 11–60)

## 2014-05-19 MED ORDER — SODIUM CHLORIDE 0.9 % IV SOLN
1000.0000 mL | INTRAVENOUS | Status: DC
  Administered 2014-05-19: 1000 mL via INTRAVENOUS

## 2014-05-19 NOTE — ED Notes (Signed)
PTAR here to provide safe transport back to facility, verbal report provided to EMTs.  NAD upon leaving dept.

## 2014-05-19 NOTE — ED Provider Notes (Signed)
CSN: [id number]     Arrival date & time 05/19/14  1755 History   First MD Initiated Contact with Patient 05/19/14 1814     Chief Complaint  Patient presents with  . Altered Mental Status   HPI Patient was sent from his nursing facility because of more confusion than normal. The patient has history of hepatitis C, dementia, pancreatic cancer and hypertension. The patient himself denies any complaints. He denies trouble with headache, chest pain, nausea or vomiting.  He denies any recent injuries. He denies any numbness or weakness. Denies having any trouble with fever. Patient states he feels like his usual state of health Past Medical History  Diagnosis Date  . Hypertension   . Hepatitis C   . Dementia   . Pancreatic cancer    History reviewed. No pertinent past surgical history. No family history on file. History  Substance Use Topics  . Smoking status: Current Every Day Smoker -- 0.50 packs/day for 25 years    Types: Cigarettes  . Smokeless tobacco: Not on file  . Alcohol Use: 3.0 oz/week    5 Cans of beer per week    Review of Systems  All other systems reviewed and are negative.     Allergies  Penicillins  Home Medications   Prior to Admission medications   Medication Sig Start Date End Date Taking? Authorizing Provider  amLODipine (NORVASC) 10 MG tablet Take 10 mg by mouth daily.   Yes Historical Provider, MD  aspirin EC 81 MG tablet Take 1 tablet (81 mg total) by mouth daily. 04/14/14  Yes Simbiso Ranga, MD  cetirizine (ZYRTEC) 10 MG tablet Take 10 mg by mouth daily.   Yes Historical Provider, MD  folic acid (FOLVITE) 1 MG tablet Take 1 tablet (1 mg total) by mouth daily. 04/14/14  Yes Simbiso Ranga, MD  furosemide (LASIX) 20 MG tablet Take 1 tablet (20 mg total) by mouth daily. 04/14/14  Yes Simbiso Ranga, MD  gabapentin (NEURONTIN) 300 MG capsule Take 300 mg by mouth every morning.    Yes Historical Provider, MD  hydrALAZINE (APRESOLINE) 25 MG tablet Take 25 mg by mouth  3 (three) times daily.   Yes Historical Provider, MD  HYDROcodone-acetaminophen (NORCO) 7.5-325 MG per tablet Take 1 tablet by mouth 2 (two) times daily as needed. 04/14/14  Yes Simbiso Ranga, MD  omeprazole (PRILOSEC) 20 MG capsule Take 20 mg by mouth daily.   Yes Historical Provider, MD  potassium chloride (K-DUR) 10 MEQ tablet Take 1 tablet (10 mEq total) by mouth daily. 04/14/14  Yes Simbiso Ranga, MD  thiamine (VITAMIN B-1) 100 MG tablet Take 1 tablet (100 mg total) by mouth daily. 04/14/14  Yes Simbiso Ranga, MD  traZODone (DESYREL) 50 MG tablet Take 50 mg by mouth at bedtime as needed for sleep.   Yes Historical Provider, MD   BP 164/74  Pulse 84  Temp(Src) 100.7 F (38.2 C) (Rectal)  Resp 18  SpO2 99% Physical Exam  Nursing note and vitals reviewed. Constitutional: He appears well-developed and well-nourished. No distress.  Pleasant, smiling  HENT:  Head: Normocephalic and atraumatic.  Right Ear: External ear normal.  Left Ear: External ear normal.  Eyes: Conjunctivae are normal. Right eye exhibits no discharge. Left eye exhibits no discharge. No scleral icterus.  Neck: Neck supple. No tracheal deviation present.  Cardiovascular: Normal rate, regular rhythm and intact distal pulses.   Pulmonary/Chest: Effort normal and breath sounds normal. No stridor. No respiratory distress. He has no wheezes. He  has no rales.  Abdominal: Soft. Bowel sounds are normal. He exhibits no distension. There is no tenderness. There is no rebound and no guarding.  Musculoskeletal: He exhibits no edema and no tenderness.  Neurological: He is alert. He has normal strength. He is disoriented. No cranial nerve deficit (no facial droop, extraocular movements intact, no slurred speech) or sensory deficit. He exhibits normal muscle tone. He displays no seizure activity. Coordination normal.  Able to move all 4 extremities equally, normal sensation, no facial asymmetry, the patient is alert to person but is confused  regarding the date as well as his location  Skin: Skin is warm and dry. No rash noted.  Psychiatric: He has a normal mood and affect.    ED Course  Procedures (including critical care time) Labs Review Labs Reviewed  CBC - Abnormal; Notable for the following:    RBC 3.53 (*)    Hemoglobin 9.3 (*)    HCT 30.1 (*)    RDW 15.6 (*)    All other components within normal limits  COMPREHENSIVE METABOLIC PANEL - Abnormal; Notable for the following:    Glucose, Bld 104 (*)    BUN 29 (*)    Albumin 2.7 (*)    AST 99 (*)    Alkaline Phosphatase 251 (*)    GFR calc non Af Amer 53 (*)    GFR calc Af Amer 61 (*)    All other components within normal limits  URINALYSIS, ROUTINE W REFLEX MICROSCOPIC - Abnormal; Notable for the following:    APPearance CLOUDY (*)    Protein, ur 100 (*)    All other components within normal limits  URINE MICROSCOPIC-ADD ON - Abnormal; Notable for the following:    Bacteria, UA FEW (*)    Casts GRANULAR CAST (*)    All other components within normal limits  CBG MONITORING, ED - Abnormal; Notable for the following:    Glucose-Capillary 107 (*)    All other components within normal limits  PROTIME-INR  AMMONIA  ETHANOL    Imaging Review Dg Chest 1 View  05/19/2014   CLINICAL DATA:  Dementia with altered mental status; history of pancreatic malignancy and hepatitis-C  EXAM: CHEST - 1 VIEW  COMPARISON:  PA and lateral chest x-ray of April 11, 2014  FINDINGS: The lungs are well-expanded. The interstitial markings are mildly increased bilaterally and there is mild central pulmonary vascular congestion which is more conspicuous than in the past. The cardiopericardial silhouette is mildly enlarged but stable. There is stable tortuosity of the descending thoracic aorta.  IMPRESSION: Engorgement of the central pulmonary vascularity is consistent with low-grade CHF which has increased in conspicuity.   Electronically Signed   By: David  Martinique   On: 05/19/2014 19:14    Ct Head Wo Contrast  05/19/2014   CLINICAL DATA:  Altered mental status ; history of dementia, pancreatic malignancy, and appetite  EXAM: CT HEAD WITHOUT CONTRAST  TECHNIQUE: Contiguous axial images were obtained from the base of the skull through the vertex without intravenous contrast.  COMPARISON:  Noncontrast CT scan of brain of April 11, 2014.  FINDINGS: There is mild diffuse cerebral and cerebellar atrophy which is stable. There is stable compensatory ventriculomegaly. There is no intracranial hemorrhage nor intracranial mass effect. There is no evidence of an evolving ischemic event. The cerebellum and brainstem exhibit normal density.  At bone window settings the observed portions of the paranasal sinuses and mastoid air cells are clear. There is no acute skull  fracture.  IMPRESSION: 1. There is no acute intracranial hemorrhagic process nor evidence of an evolving ischemic event. 2. There is mild chronic age related atrophic change.   Electronically Signed   By: David  Martinique   On: 05/19/2014 19:12      MDM   Final diagnoses:  Confusion    Pt does have history of dementia.  In the ED, he is alert, not somnolent and able to answer questions appropriately.    Normal ammonia.  No sign of acute infection.  Pt has no complaints in the ED.   At this time there does not appear to be any evidence of an acute emergency medical condition and the patient appears stable for discharge with appropriate outpatient follow up.     Dorie Rank, MD 05/19/14 2035

## 2014-05-19 NOTE — ED Notes (Signed)
PTAR contacted to provide transport home.

## 2014-05-19 NOTE — ED Notes (Signed)
PER EMS - pt from [facility] with "more confused than normal" per staff at facility, pt denies complaints.  Awake, alert.  NAD.

## 2014-05-19 NOTE — Discharge Instructions (Signed)
Confusion °Confusion is the inability to think with your usual speed or clarity. Confusion may come on quickly or slowly over time. How quickly the confusion comes on depends on the cause. Confusion can be due to any number of causes. °CAUSES  °· Concussion, head injury, or head trauma. °· Seizures. °· Stroke. °· Fever. °· Senility. °· Heightened emotional states like rage or terror. °· Mental illness in which the person loses the ability to determine what is real and what is not (hallucinations). °· Infections. °· Toxic effects from alcohol, drugs, or prescription medicines. °· Dehydration and an imbalance of salts in the body (electrolytes). °· Lack of sleep. °· Low blood sugar (diabetes). °· Low levels of oxygen (for example from chronic lung disorders). °· Drug interactions or other medication side effects. °· Nutritional deficiencies, especially niacin, thiamine, vitamin C, or vitamin B. °· Sudden drop in body temperature (hypothermia). °· Illness in the elderly. Constipation can result in confusion. An elderly person who is hospitalized may become confused due to change in daily routine. °SYMPTOMS  °People often describe their thinking as cloudy or unclear when they are confused. Confusion can also include feeling disoriented. That means you are unaware of where or who you are. You may also not know what the date or time is. If confused, you may also have difficulty paying attention, remembering and making decisions. Some people also act aggressively when they are confused.  °DIAGNOSIS  °The medical evaluation of confusion may include: °· Blood and urine tests. °· X-rays. °· Brain and nervous system tests. °· Analyzing your brain waves (electroencphalogram or EEG). °· A special X-ray (MRI) of your head or other special studies. °Your physician will ask questions such as: °· Do you get days and nights mixed up? °· Are you awake during regular sleep times? °· Do you have trouble recognizing people? °· Do you  know where you are? °· Do you know the date and time? °· Does the confusion come and go? °· Is the confusion quickly getting worse? °· Has there been a recent illness? °· Has there been a recent head injury? °· Are you diabetic? °· Do you have a lung disorder? °· What medication are you taking? °· Have you taken drugs or alcohol? °TREATMENT  °An admission to the hospital may not be needed, but a confused person should not be left alone. Stay with a family member or friend until the confusion clears. Avoid alcohol, pain relievers or sedative drugs until you have fully recovered. Do not drive until your caregiver says it is okay. °HOME CARE INSTRUCTIONS °What family and friends can do: °· To find out if someone is confused ask him or her their name, age, and the date. If the person is unsure or answers incorrectly, he or she is confused. °· Always introduce yourself, no matter how well the person knows you. °· Often remind the person of his or her location. °· Place a calendar and clock near the confused person. °· Talk about current events and plans for the day. °· Try to keep the environment calm, quiet and peaceful. °· Make sure the patient keeps follow up appointments with their physician. °PREVENTION  °Ways to prevent confusion: °· Avoid alcohol. °· Eat a balanced diet. °· Get enough sleep. °· Do not become isolated. Spend time with other people and make plans for your days. °· Keep careful watch on your blood sugar levels if you are diabetic. °SEEK IMMEDIATE MEDICAL CARE IF:  °· You develop   severe headaches, repeated vomiting, seizures, blackouts or slurred speech. °· There is increasing confusion, weakness, numbness, restlessness or personality changes. °· You develop a loss of balance, have marked dizziness, feel uncoordinated or fall. °· You have delusions, hallucinations or develop severe anxiety. °· Your family members think you need to be rechecked. °Document Released: 01/08/2005 Document Revised:  02/23/2012 Document Reviewed: 09/05/2008 °[employer]® Patient Information ©2014 [employer]. ° °

## 2014-05-19 NOTE — ED Notes (Signed)
Pt to radiology.

## 2014-05-19 NOTE — ED Notes (Signed)
Initial Contact - pt resting on stretcher, awake, alert, able to follow simple commands and answer simple questions.  Pt c/o "feeling cold in here", otherwise denies complaints.  Speaking full/clear sentences, rr even/un-lab, lsctab, dim throughout.  Skin PWD.  MAEI, +csm/+pulses.  Pt changed to hospital gown, placed to cardiac/02 monitor.  NAD.

## 2014-09-02 ENCOUNTER — Observation Stay (HOSPITAL_COMMUNITY)
Admission: EM | Admit: 2014-09-02 | Discharge: 2014-09-04 | Disposition: A | Discharge status: Home / Self Care | Payer: Medicare Other | Attending: Internal Medicine | Admitting: Internal Medicine

## 2014-09-02 ENCOUNTER — Emergency Department (HOSPITAL_COMMUNITY): Payer: Medicare Other

## 2014-09-02 ENCOUNTER — Encounter (HOSPITAL_COMMUNITY): Payer: Self-pay | Admitting: Emergency Medicine

## 2014-09-02 DIAGNOSIS — Z7982 Long term (current) use of aspirin: Secondary | ICD-10-CM | POA: Insufficient documentation

## 2014-09-02 DIAGNOSIS — F17200 Nicotine dependence, unspecified, uncomplicated: Secondary | ICD-10-CM | POA: Insufficient documentation

## 2014-09-02 DIAGNOSIS — C7952 Secondary malignant neoplasm of bone marrow: Secondary | ICD-10-CM | POA: Diagnosis not present

## 2014-09-02 DIAGNOSIS — Z8619 Personal history of other infectious and parasitic diseases: Secondary | ICD-10-CM | POA: Diagnosis not present

## 2014-09-02 DIAGNOSIS — R29898 Other symptoms and signs involving the musculoskeletal system: Secondary | ICD-10-CM | POA: Diagnosis not present

## 2014-09-02 DIAGNOSIS — F0390 Unspecified dementia without behavioral disturbance: Secondary | ICD-10-CM | POA: Diagnosis not present

## 2014-09-02 DIAGNOSIS — R627 Adult failure to thrive: Secondary | ICD-10-CM

## 2014-09-02 DIAGNOSIS — Z79899 Other long term (current) drug therapy: Secondary | ICD-10-CM | POA: Diagnosis not present

## 2014-09-02 DIAGNOSIS — I10 Essential (primary) hypertension: Secondary | ICD-10-CM | POA: Diagnosis not present

## 2014-09-02 DIAGNOSIS — Z8509 Personal history of malignant neoplasm of other digestive organs: Secondary | ICD-10-CM | POA: Insufficient documentation

## 2014-09-02 DIAGNOSIS — C61 Malignant neoplasm of prostate: Secondary | ICD-10-CM | POA: Diagnosis not present

## 2014-09-02 DIAGNOSIS — Z66 Do not resuscitate: Secondary | ICD-10-CM | POA: Diagnosis not present

## 2014-09-02 DIAGNOSIS — C7951 Secondary malignant neoplasm of bone: Secondary | ICD-10-CM | POA: Diagnosis not present

## 2014-09-02 DIAGNOSIS — Z880 Allergy status to penicillin: Secondary | ICD-10-CM | POA: Insufficient documentation

## 2014-09-02 DIAGNOSIS — M79609 Pain in unspecified limb: Secondary | ICD-10-CM | POA: Diagnosis present

## 2014-09-02 DIAGNOSIS — E440 Moderate protein-calorie malnutrition: Secondary | ICD-10-CM | POA: Insufficient documentation

## 2014-09-02 LAB — CBC WITH DIFFERENTIAL/PLATELET
BASOS ABS: 0 10*3/uL (ref 0.0–0.1)
BASOS PCT: 1 % (ref 0–1)
Eosinophils Absolute: 0.1 10*3/uL (ref 0.0–0.7)
Eosinophils Relative: 3 % (ref 0–5)
HEMATOCRIT: 34.8 % — AB (ref 39.0–52.0)
Hemoglobin: 11.1 g/dL — ABNORMAL LOW (ref 13.0–17.0)
LYMPHS PCT: 38 % (ref 12–46)
Lymphs Abs: 1.4 10*3/uL (ref 0.7–4.0)
MCH: 28.7 pg (ref 26.0–34.0)
MCHC: 31.9 g/dL (ref 30.0–36.0)
MCV: 89.9 fL (ref 78.0–100.0)
Monocytes Absolute: 0.4 10*3/uL (ref 0.1–1.0)
Monocytes Relative: 11 % (ref 3–12)
NEUTROS ABS: 1.8 10*3/uL (ref 1.7–7.7)
Neutrophils Relative %: 49 % (ref 43–77)
PLATELETS: 206 10*3/uL (ref 150–400)
RBC: 3.87 MIL/uL — ABNORMAL LOW (ref 4.22–5.81)
RDW: 15.2 % (ref 11.5–15.5)
WBC: 3.6 10*3/uL — AB (ref 4.0–10.5)

## 2014-09-02 LAB — COMPREHENSIVE METABOLIC PANEL
ALT: 30 U/L (ref 0–53)
AST: 61 U/L — AB (ref 0–37)
Albumin: 2.9 g/dL — ABNORMAL LOW (ref 3.5–5.2)
Alkaline Phosphatase: 121 U/L — ABNORMAL HIGH (ref 39–117)
Anion gap: 13 (ref 5–15)
BILIRUBIN TOTAL: 0.9 mg/dL (ref 0.3–1.2)
BUN: 27 mg/dL — ABNORMAL HIGH (ref 6–23)
CALCIUM: 9.2 mg/dL (ref 8.4–10.5)
CHLORIDE: 103 meq/L (ref 96–112)
CO2: 23 meq/L (ref 19–32)
Creatinine, Ser: 1.19 mg/dL (ref 0.50–1.35)
GFR calc Af Amer: 70 mL/min — ABNORMAL LOW (ref >90)
GFR calc non Af Amer: 60 mL/min — ABNORMAL LOW (ref >90)
Glucose, Bld: 84 mg/dL (ref 70–99)
Potassium: 4.4 mEq/L (ref 3.7–5.3)
SODIUM: 139 meq/L (ref 137–147)
Total Protein: 8.1 g/dL (ref 6.0–8.3)

## 2014-09-02 LAB — URINE MICROSCOPIC-ADD ON

## 2014-09-02 LAB — URINALYSIS, ROUTINE W REFLEX MICROSCOPIC
Bilirubin Urine: NEGATIVE
GLUCOSE, UA: NEGATIVE mg/dL
Ketones, ur: NEGATIVE mg/dL
LEUKOCYTES UA: NEGATIVE
Nitrite: NEGATIVE
PROTEIN: 100 mg/dL — AB
Specific Gravity, Urine: 1.013 (ref 1.005–1.030)
UROBILINOGEN UA: 1 mg/dL (ref 0.0–1.0)
pH: 6 (ref 5.0–8.0)

## 2014-09-02 LAB — MRSA PCR SCREENING: MRSA BY PCR: NEGATIVE

## 2014-09-02 MED ORDER — ENZALUTAMIDE 40 MG PO CAPS
160.0000 mg | ORAL_CAPSULE | Freq: Two times a day (BID) | ORAL | Status: DC
  Filled 2014-09-02: qty 4

## 2014-09-02 MED ORDER — GABAPENTIN 300 MG PO CAPS
300.0000 mg | ORAL_CAPSULE | Freq: Two times a day (BID) | ORAL | Status: DC
  Administered 2014-09-02 – 2014-09-04 (×5): 300 mg via ORAL
  Filled 2014-09-02 (×6): qty 1

## 2014-09-02 MED ORDER — ENOXAPARIN SODIUM 40 MG/0.4ML ~~LOC~~ SOLN
40.0000 mg | SUBCUTANEOUS | Status: DC
  Administered 2014-09-02 – 2014-09-03 (×2): 40 mg via SUBCUTANEOUS
  Filled 2014-09-02 (×3): qty 0.4

## 2014-09-02 MED ORDER — FUROSEMIDE 20 MG PO TABS
20.0000 mg | ORAL_TABLET | Freq: Every day | ORAL | Status: DC
  Administered 2014-09-02 – 2014-09-04 (×3): 20 mg via ORAL
  Filled 2014-09-02 (×3): qty 1

## 2014-09-02 MED ORDER — ENZALUTAMIDE 40 MG PO CAPS
80.0000 mg | ORAL_CAPSULE | Freq: Two times a day (BID) | ORAL | Status: DC
  Administered 2014-09-02 – 2014-09-03 (×3): 80 mg via ORAL
  Filled 2014-09-02: qty 2

## 2014-09-02 MED ORDER — POTASSIUM CHLORIDE ER 10 MEQ PO TBCR
10.0000 meq | EXTENDED_RELEASE_TABLET | Freq: Every day | ORAL | Status: DC
  Administered 2014-09-02 – 2014-09-04 (×3): 10 meq via ORAL
  Filled 2014-09-02 (×3): qty 1

## 2014-09-02 MED ORDER — FOLIC ACID 1 MG PO TABS
1.0000 mg | ORAL_TABLET | Freq: Every day | ORAL | Status: DC
  Administered 2014-09-02 – 2014-09-04 (×3): 1 mg via ORAL
  Filled 2014-09-02 (×3): qty 1

## 2014-09-02 MED ORDER — HYDROCODONE-ACETAMINOPHEN 5-325 MG PO TABS
1.0000 | ORAL_TABLET | Freq: Four times a day (QID) | ORAL | Status: DC | PRN
  Administered 2014-09-03: 1 via ORAL
  Filled 2014-09-02: qty 1

## 2014-09-02 MED ORDER — ONDANSETRON HCL 4 MG/2ML IJ SOLN: 4.0000 mg | Freq: Four times a day (QID) | INTRAMUSCULAR | Status: DC | PRN

## 2014-09-02 MED ORDER — ONDANSETRON HCL 4 MG PO TABS: 4.0000 mg | ORAL_TABLET | Freq: Four times a day (QID) | ORAL | Status: DC | PRN

## 2014-09-02 MED ORDER — ACETAMINOPHEN 325 MG PO TABS: 650.0000 mg | ORAL_TABLET | Freq: Four times a day (QID) | ORAL | Status: DC | PRN

## 2014-09-02 MED ORDER — HYDRALAZINE HCL 25 MG PO TABS
25.0000 mg | ORAL_TABLET | Freq: Three times a day (TID) | ORAL | Status: DC
  Administered 2014-09-02 – 2014-09-03 (×2): 25 mg via ORAL
  Filled 2014-09-02 (×8): qty 1

## 2014-09-02 MED ORDER — HYDROCODONE-ACETAMINOPHEN 5-325 MG PO TABS
1.0000 | ORAL_TABLET | Freq: Two times a day (BID) | ORAL | Status: DC
  Administered 2014-09-02 – 2014-09-04 (×4): 1 via ORAL
  Filled 2014-09-02 (×4): qty 1

## 2014-09-02 MED ORDER — ASPIRIN EC 81 MG PO TBEC
81.0000 mg | DELAYED_RELEASE_TABLET | Freq: Every day | ORAL | Status: DC
  Administered 2014-09-02 – 2014-09-04 (×3): 81 mg via ORAL
  Filled 2014-09-02 (×3): qty 1

## 2014-09-02 MED ORDER — VITAMIN B-1 100 MG PO TABS
100.0000 mg | ORAL_TABLET | Freq: Every day | ORAL | Status: DC
  Administered 2014-09-02 – 2014-09-04 (×3): 100 mg via ORAL
  Filled 2014-09-02 (×3): qty 1

## 2014-09-02 MED ORDER — ALUM & MAG HYDROXIDE-SIMETH 200-200-20 MG/5ML PO SUSP: 30.0000 mL | Freq: Four times a day (QID) | ORAL | Status: DC | PRN

## 2014-09-02 MED ORDER — TRAZODONE HCL 50 MG PO TABS: 50.0000 mg | ORAL_TABLET | Freq: Every evening | ORAL | Status: DC | PRN

## 2014-09-02 MED ORDER — PANTOPRAZOLE SODIUM 40 MG PO TBEC
40.0000 mg | DELAYED_RELEASE_TABLET | Freq: Every day | ORAL | Status: DC
  Administered 2014-09-02 – 2014-09-04 (×3): 40 mg via ORAL
  Filled 2014-09-02 (×3): qty 1

## 2014-09-02 NOTE — ED Provider Notes (Signed)
CSN: [id number]     Arrival date & time 09/02/14  1054 History   First MD Initiated Contact with Patient 09/02/14 1100     Chief Complaint  Patient presents with  . Leg Pain     HPI Per EMS pt coming from [place] home with c/o bilateral leg pain x 3 days, per nursing staff pt was ambulating independently prior to this, they report he appears to feel weak now. Pain from hips to knees, pt has hx hx of prostate and pancreatic cancer.  Past Medical History  Diagnosis Date  . Hypertension   . Hepatitis C   . Dementia   . Pancreatic cancer    History reviewed. No pertinent past surgical history. History reviewed. No pertinent family history. History  Substance Use Topics  . Smoking status: Current Every Day Smoker -- 0.50 packs/day for 25 years    Types: Cigarettes  . Smokeless tobacco: Not on file  . Alcohol Use: 3.0 oz/week    5 Cans of beer per week    Review of Systems  Unable to perform ROS: Dementia      Allergies  Penicillins  Home Medications   Prior to Admission medications   Medication Sig Start Date End Date Taking? Authorizing Provider  acetaminophen (TYLENOL) 325 MG tablet Take 650 mg by mouth every 6 (six) hours as needed for mild pain, fever or headache.   Yes Historical Provider, MD  alum & mag hydroxide-simeth (MAALOX/MYLANTA) 200-200-20 MG/5ML suspension Take 30 mLs by mouth every 6 (six) hours as needed for indigestion or heartburn.   Yes Historical Provider, MD  amLODipine (NORVASC) 10 MG tablet Take 10 mg by mouth daily.   Yes Historical Provider, MD  aspirin EC 81 MG tablet Take 1 tablet (81 mg total) by mouth daily. 04/14/14  Yes Simbiso Ranga, MD  bisacodyl (FLEET) 10 MG/30ML ENEM Place 10 mg rectally once.   Yes Historical Provider, MD  cetirizine (ZYRTEC) 10 MG tablet Take 10 mg by mouth daily.   Yes Historical Provider, MD  dimenhyDRINATE (DRAMAMINE) 50 MG tablet Take 50 mg by mouth every 4 (four) hours as needed for nausea or dizziness.    Yes Historical Provider, MD  enzalutamide Gillermina Phy) 40 MG capsule Take 160 mg by mouth every 12 (twelve) hours.   Yes Historical Provider, MD  folic acid (FOLVITE) 1 MG tablet Take 1 tablet (1 mg total) by mouth daily. 04/14/14  Yes Simbiso Ranga, MD  furosemide (LASIX) 20 MG tablet Take 1 tablet (20 mg total) by mouth daily. 04/14/14  Yes Simbiso Ranga, MD  gabapentin (NEURONTIN) 300 MG capsule Take 300 mg by mouth 2 (two) times daily.    Yes Historical Provider, MD  guaifenesin (ROBITUSSIN) 100 MG/5ML syrup Take 200 mg by mouth 3 (three) times daily as needed for cough.   Yes Historical Provider, MD  hydrALAZINE (APRESOLINE) 25 MG tablet Take 25 mg by mouth 3 (three) times daily.   Yes Historical Provider, MD  HYDROcodone-acetaminophen (NORCO/VICODIN) 5-325 MG per tablet Take 1 tablet by mouth 2 (two) times daily.   Yes Historical Provider, MD  loperamide (IMODIUM) 2 MG capsule Take 2 mg by mouth as needed for diarrhea or loose stools (maximum 3 in 12 hours).   Yes Historical Provider, MD  magnesium hydroxide (MILK OF MAGNESIA) 400 MG/5ML suspension Take 30 mLs by mouth daily as needed for mild constipation.   Yes Historical Provider, MD  neomycin-bacitracin-polymyxin (NEOSPORIN) ophthalmic ointment Apply 1 application topically daily as needed (  for wound).   Yes Historical Provider, MD  omeprazole (PRILOSEC) 20 MG capsule Take 20 mg by mouth daily.   Yes Historical Provider, MD  potassium chloride (K-DUR) 10 MEQ tablet Take 1 tablet (10 mEq total) by mouth daily. 04/14/14  Yes Simbiso Ranga, MD  PRESCRIPTION MEDICATION Take 237 mLs by mouth 3 (three) times daily with meals. Great shakes   Yes Historical Provider, MD  thiamine (VITAMIN B-1) 100 MG tablet Take 1 tablet (100 mg total) by mouth daily. 04/14/14  Yes Simbiso Ranga, MD  traZODone (DESYREL) 50 MG tablet Take 50 mg by mouth at bedtime as needed for sleep.   Yes Historical Provider, MD   BP 148/82  Pulse 57  Temp(Src) 97.5 F (36.4 C) (Oral)   Resp 16  SpO2 100% Physical Exam  Nursing note and vitals reviewed. Constitutional: He appears well-developed. No distress.  HENT:  Head: Normocephalic and atraumatic.  Eyes: Pupils are equal, round, and reactive to light.  Neck: Normal range of motion.  Cardiovascular: Normal rate and intact distal pulses.   Pulmonary/Chest: No respiratory distress.  Abdominal: Normal appearance. He exhibits no distension.  Musculoskeletal:       Left hip: He exhibits decreased range of motion, tenderness and bony tenderness.       Lumbar back: He exhibits tenderness and bony tenderness.  Neurological: He is alert. No cranial nerve deficit.  Skin: Skin is warm and dry. No rash noted.    ED Course  Procedures (including critical care time) Labs Review Labs Reviewed  CBC WITH DIFFERENTIAL - Abnormal; Notable for the following:    WBC 3.6 (*)    RBC 3.87 (*)    Hemoglobin 11.1 (*)    HCT 34.8 (*)    All other components within normal limits  COMPREHENSIVE METABOLIC PANEL - Abnormal; Notable for the following:    BUN 27 (*)    Albumin 2.9 (*)    AST 61 (*)    Alkaline Phosphatase 121 (*)    GFR calc non Af Amer 60 (*)    GFR calc Af Amer 70 (*)    All other components within normal limits    Imaging Review Dg Lumbar Spine Complete  09/02/2014   CLINICAL DATA:  Low back and hip pain. History of pancreatic cancer.  EXAM: LUMBAR SPINE - COMPLETE 4+ VIEW  COMPARISON:  Pelvis CT dated 03/20/2014 and abdomen and pelvis CT dated 04/30/2012.  FINDINGS: Five non-rib-bearing lumbar vertebrae. Interval diffuse sclerosis in the L1 vertebral body and patchy sclerosis in the L3 vertebral body. There is also some sclerosis in the superior aspect of the S1 vertebral body and in the region of the left sacroiliac joint. No fractures, pars defect or subluxations. Atheromatous arterial calcifications.  IMPRESSION: Interval areas of sclerosis in the spine and pelvis, as described above, most compatible with  sclerotic metastatic disease.   Electronically Signed   By: Enrique Sack M.D.   On: 09/02/2014 13:20   Dg Pelvis 1-2 Views  09/02/2014   CLINICAL DATA:  Low back and pelvis pain. History of pancreatic cancer. A history of trauma was also given.  EXAM: PELVIS - 1-2 VIEW  COMPARISON:  Pelvis CT dated 03/20/2014 and lumbar spine radiographs obtained today.  FINDINGS: Oval, rim like calcific density in the right pelvis, corresponding to a penile prosthesis reservoir on the previous CT. The penile prosthesis is also noted. Interval mild patchy sclerosis in the region of the left sacroiliac joint. There is also some ill-defined sclerosis  in the left superior pubic ramus. No fracture or dislocation seen.  IMPRESSION: Areas of subtle sclerosis in the region of the left sacroiliac joint and left superior pubic ramus, suspicious for sclerotic metastatic disease. No fracture or dislocation.   Electronically Signed   By: Enrique Sack M.D.   On: 09/02/2014 13:23      MDM   Final diagnoses:  Malignant neoplasm of prostate metastatic to bone        Dot Lanes, MD 09/02/14 1407

## 2014-09-02 NOTE — ED Notes (Signed)
Patient transported to X-ray 

## 2014-09-02 NOTE — Evaluation (Signed)
Physical Therapy Evaluation Patient Details Name: Joseph Robbins MRN: [id number] DOB: [birth year] Today's Date: 09/02/2014   History of Present Illness  Pt admit with hx of pancreatic cancer, and from SNF (there since June 2015 after recent hospital admission). REports state he had been ambulating in hallways at [facility] with cane and RW, however unable to last few days and with BLE weakness and pain unpon standing and walking. x-ray of lumbar spine and pelvis shows sclerotic metastatic disease without fractures  Clinical Impression  Pt to benefit from PT to get pt to level of safety with ambulate of PTA (waling in halls at [facility]) .     Follow Up Recommendations SNF    Equipment Recommendations  None recommended by PT    Recommendations for Other Services       Precautions / Restrictions Precautions Precautions: Fall Restrictions Weight Bearing Restrictions: No      Mobility  Bed Mobility Overal bed mobility: Needs Assistance;+2 for physical assistance Bed Mobility: Supine to Sit;Sit to Supine     Supine to sit: Mod assist Sit to supine: Mod assist   General bed mobility comments: max cues and tactile to follow automatic commands   Transfers Overall transfer level: Needs assistance Equipment used: 2 person hand held assist Transfers: Sit to/from Stand Sit to Stand: Mod assist            Ambulation/Gait Ambulation/Gait assistance: +2 physical assistance Ambulation Distance (Feet): 6 Feet Assistive device: 2 person hand held assist Gait Pattern/deviations: Step-through pattern     General Gait Details: small shuffled steps  Stairs            Wheelchair Mobility    Modified Rankin (Stroke Patients Only)       Balance                                             Pertinent Vitals/Pain Pain Assessment:  (unable to stae, however held RLE with amy movment and guarded it . Alos when automatic movments (like pulling on sock) pt unable to lift  LLE and states "oh shit" with facial grimaces. So hard to detect wheree the location of pain is located. ) Pain Location: possible somewhere in upper leg region hard to assess, hip, knee or thigh.  Pain Intervention(s): Repositioned    Home Living Family/patient expects to be discharged to:: Skilled nursing facility                 Additional Comments: pt unable to give history.     Prior Function Level of Independence: Needs assistance         Comments: gets assistance from SNF (unclear on how much needed due to pt unable to give history)      Hand Dominance        Extremity/Trunk Assessment               Lower Extremity Assessment: Generalized weakness;LLE deficits/detail   LLE Deficits / Details: pt would not lift or movement LLE without holding and guarding it with UES.      Communication   Communication:  (pt unable to following commands or clear answers. Did state his first and last name unpon asking, .)  Cognition Arousal/Alertness: Awake/alert Behavior During Therapy:  (challengin to follow commnads due to dementia) Overall Cognitive Status: Difficult to assess  General Comments      Exercises        Assessment/Plan    PT Assessment Patient needs continued PT services  PT Diagnosis Generalized weakness;Abnormality of gait   PT Problem List Decreased activity tolerance;Decreased mobility;Decreased safety awareness  PT Treatment Interventions Functional mobility training;Therapeutic activities;Therapeutic exercise;Gait training   PT Goals (Current goals can be found in the Care Plan section) Acute Rehab PT Goals Patient Stated Goal: unable to state PT Goal Formulation: Patient unable to participate in goal setting Time For Goal Achievement: 09/16/14 Potential to Achieve Goals: Fair    Frequency Min 3X/week   Barriers to discharge        Co-evaluation               End of Session Equipment Utilized  During Treatment: Gait belt Activity Tolerance: Patient limited by pain;Other (comment) (limited by ability to follow commands at this time and LLE pain difficult to assess. Could weigh bear on it though. ) Patient left: in bed;with nursing/sitter in room;with bed alarm set Nurse Communication: Mobility status         Time: 9476-5465 PT Time Calculation (min): 35 min   Charges:   PT Evaluation $Initial PT Evaluation Tier I: 1 Procedure PT Treatments $Gait Training: 8-22 mins $Therapeutic Activity: 8-22 mins   PT G CodesClide Dales 09/02/2014, 6:09 PM Clide Dales, PT Pager: [phone] 09/02/2014

## 2014-09-02 NOTE — ED Notes (Signed)
Bed: WA03 Expected date: 09/02/14 Expected time: 10:39 AM Means of arrival: Ambulance Comments: Bil leg pain

## 2014-09-02 NOTE — H&P (Signed)
History and Physical  Joseph Robbins FUX:[id number] DOB: 01-24-1946 DOA: 09/02/2014  Referring physician: Dr. Leonard Schwartz PCP: Benito Mccreedy, MD  Outpatient Specialists:  1. Urology: Dr. Kathie Rhodes  Chief Complaint: Lower extremity weakness/pain.  HPI: Joseph Robbins is a 68 y.o. male, resident of ALF, with history of advanced dementia, prostate cancer with skeletal metastasis, chronic diastolic CHF, hepatitis C, hypertension, prior polysubstance abuse (alcohol, tobacco and crack cocaine) presented to the ED with complaints of bilateral lower extremity weakness/pain. Patient unable to provide any history secondary to advanced dementia. History obtained from patient's daughter Ms. Joseph Robbins at bedside. Daughter states that patient has been progressively declining physically and mentally since June 2015. Prior to that, he lived alone. At baseline he barely recognizes his family members and most times keeps talking to himself with occasional periods of agitation. He was able to walk without cane or walker and she took him out a couple times a week. He has had chronic low back and bilateral hip and pelvic pain since June 2015. The nursing facility reported to her that for the last 2 days, they have been unable to get him to walk and it appears as though when they tried to stand him up he grimaces in pain. Appetite said to be good. No other history available. In the ED, x-ray of lumbar spine and pelvis shows sclerotic metastatic disease without fractures. Hospitalist admission requested  Review of Systems: All systems reviewed and apart from history of presenting illness, are negative.  Past Medical History  Diagnosis Date  . Hypertension   . Hepatitis C   . Dementia   . Pancreatic cancer    History reviewed. No pertinent past surgical history. Social History:  reports that he has been smoking Cigarettes.  He has a 12.5 pack-year smoking history. He does not have any smokeless tobacco  history on file. He reports that he drinks about 3 ounces of alcohol per week. He reports that he uses illicit drugs (Cocaine and "Crack" cocaine). Divorced. Resident at assisted living facility since June 2015. No substance abuse since admission to ALF.   Allergies  Allergen Reactions  . Penicillins Anaphylaxis    History reviewed. No pertinent family history. negative family history.  Prior to Admission medications   Medication Sig Start Date End Date Taking? Authorizing Provider  acetaminophen (TYLENOL) 325 MG tablet Take 650 mg by mouth every 6 (six) hours as needed for mild pain, fever or headache.   Yes Historical Provider, MD  alum & mag hydroxide-simeth (MAALOX/MYLANTA) 200-200-20 MG/5ML suspension Take 30 mLs by mouth every 6 (six) hours as needed for indigestion or heartburn.   Yes Historical Provider, MD  amLODipine (NORVASC) 10 MG tablet Take 10 mg by mouth daily.   Yes Historical Provider, MD  aspirin EC 81 MG tablet Take 1 tablet (81 mg total) by mouth daily. 04/14/14  Yes Simbiso Ranga, MD  bisacodyl (FLEET) 10 MG/30ML ENEM Place 10 mg rectally once.   Yes Historical Provider, MD  cetirizine (ZYRTEC) 10 MG tablet Take 10 mg by mouth daily.   Yes Historical Provider, MD  dimenhyDRINATE (DRAMAMINE) 50 MG tablet Take 50 mg by mouth every 4 (four) hours as needed for nausea or dizziness.   Yes Historical Provider, MD  enzalutamide Gillermina Phy) 40 MG capsule Take 160 mg by mouth every 12 (twelve) hours.   Yes Historical Provider, MD  folic acid (FOLVITE) 1 MG tablet Take 1 tablet (1 mg total) by mouth daily. 04/14/14  Yes Simbiso Ranga,  MD  furosemide (LASIX) 20 MG tablet Take 1 tablet (20 mg total) by mouth daily. 04/14/14  Yes Simbiso Ranga, MD  gabapentin (NEURONTIN) 300 MG capsule Take 300 mg by mouth 2 (two) times daily.    Yes Historical Provider, MD  guaifenesin (ROBITUSSIN) 100 MG/5ML syrup Take 200 mg by mouth 3 (three) times daily as needed for cough.   Yes Historical Provider, MD    hydrALAZINE (APRESOLINE) 25 MG tablet Take 25 mg by mouth 3 (three) times daily.   Yes Historical Provider, MD  HYDROcodone-acetaminophen (NORCO/VICODIN) 5-325 MG per tablet Take 1 tablet by mouth 2 (two) times daily.   Yes Historical Provider, MD  loperamide (IMODIUM) 2 MG capsule Take 2 mg by mouth as needed for diarrhea or loose stools (maximum 3 in 12 hours).   Yes Historical Provider, MD  magnesium hydroxide (MILK OF MAGNESIA) 400 MG/5ML suspension Take 30 mLs by mouth daily as needed for mild constipation.   Yes Historical Provider, MD  neomycin-bacitracin-polymyxin (NEOSPORIN) ophthalmic ointment Apply 1 application topically daily as needed (for wound).   Yes Historical Provider, MD  omeprazole (PRILOSEC) 20 MG capsule Take 20 mg by mouth daily.   Yes Historical Provider, MD  potassium chloride (K-DUR) 10 MEQ tablet Take 1 tablet (10 mEq total) by mouth daily. 04/14/14  Yes Simbiso Ranga, MD  PRESCRIPTION MEDICATION Take 237 mLs by mouth 3 (three) times daily with meals. Great shakes   Yes Historical Provider, MD  thiamine (VITAMIN B-1) 100 MG tablet Take 1 tablet (100 mg total) by mouth daily. 04/14/14  Yes Simbiso Ranga, MD  traZODone (DESYREL) 50 MG tablet Take 50 mg by mouth at bedtime as needed for sleep.   Yes Historical Provider, MD   Physical Exam: Filed Vitals:   09/02/14 1054 09/02/14 1357  BP: 148/82 148/82  Pulse: 51 57  Temp: 97.5 F (36.4 C)   TempSrc: Oral   Resp: 16 16  SpO2: 100% 100%     General exam: Moderately built and nourished pleasant elderly male patient, sitting up comfortably on the gurney in no obvious distress.  Head, eyes and ENT: Nontraumatic and normocephalic. Pupils equally reacting to light and accommodation. Oral mucosa moist.  Neck: Supple. No JVD, carotid bruit or thyromegaly.  Lymphatics: No lymphadenopathy.  Respiratory system: Clear to auscultation. No increased work of breathing.  Cardiovascular system: S1 and S2 heard, RRR. No JVD,  murmurs, gallops, clicks or pedal edema.  Gastrointestinal system: Abdomen is nondistended, soft and nontender. Normal bowel sounds heard. No organomegaly or masses appreciated.  Central nervous system: Alert and not oriented. Does not follow majority of instructions. Exam limited due to lack of cooperation related to advanced dementia. No focal neurological deficits.  Extremities: Symmetric 5 x 5 power in upper extremities. As stated above, lower extremity power assessment limited due to lack of patient cooperation. Seems to at least have grade 4 x 5 power in the right lower extremity and grade 3 x 5 power in left lower extremity. 1+ reflexes and flexor plantars. Pain sensation preserved and lower extremities. Peripheral pulses symmetrically felt. Patient's lack of cooperation to lower extremity power evaluation may be due to pain.  Skin: No rashes or acute findings.  Musculoskeletal system: Negative exam.  Psychiatry: Pleasant and cooperative.   Labs on Admission:  Basic Metabolic Panel:  Recent Labs Lab 09/02/14 1210  NA 139  K 4.4  CL 103  CO2 23  GLUCOSE 84  BUN 27*  CREATININE 1.19  CALCIUM 9.2  Liver Function Tests:  Recent Labs Lab 09/02/14 1210  AST 61*  ALT 30  ALKPHOS 121*  BILITOT 0.9  PROT 8.1  ALBUMIN 2.9*   No results found for this basename: LIPASE, AMYLASE,  in the last 168 hours No results found for this basename: AMMONIA,  in the last 168 hours CBC:  Recent Labs Lab 09/02/14 1210  WBC 3.6*  NEUTROABS 1.8  HGB 11.1*  HCT 34.8*  MCV 89.9  PLT 206   Cardiac Enzymes: No results found for this basename: CKTOTAL, CKMB, CKMBINDEX, TROPONINI,  in the last 168 hours  BNP (last 3 results)  Recent Labs  04/13/14 0710  PROBNP 1697.0*   CBG: No results found for this basename: GLUCAP,  in the last 168 hours  Radiological Exams on Admission: Dg Lumbar Spine Complete  09/02/2014   CLINICAL DATA:  Low back and hip pain. History of pancreatic  cancer.  EXAM: LUMBAR SPINE - COMPLETE 4+ VIEW  COMPARISON:  Pelvis CT dated 03/20/2014 and abdomen and pelvis CT dated 04/30/2012.  FINDINGS: Five non-rib-bearing lumbar vertebrae. Interval diffuse sclerosis in the L1 vertebral body and patchy sclerosis in the L3 vertebral body. There is also some sclerosis in the superior aspect of the S1 vertebral body and in the region of the left sacroiliac joint. No fractures, pars defect or subluxations. Atheromatous arterial calcifications.  IMPRESSION: Interval areas of sclerosis in the spine and pelvis, as described above, most compatible with sclerotic metastatic disease.   Electronically Signed   By: Enrique Sack M.D.   On: 09/02/2014 13:20   Dg Pelvis 1-2 Views  09/02/2014   CLINICAL DATA:  Low back and pelvis pain. History of pancreatic cancer. A history of trauma was also given.  EXAM: PELVIS - 1-2 VIEW  COMPARISON:  Pelvis CT dated 03/20/2014 and lumbar spine radiographs obtained today.  FINDINGS: Oval, rim like calcific density in the right pelvis, corresponding to a penile prosthesis reservoir on the previous CT. The penile prosthesis is also noted. Interval mild patchy sclerosis in the region of the left sacroiliac joint. There is also some ill-defined sclerosis in the left superior pubic ramus. No fracture or dislocation seen.  IMPRESSION: Areas of subtle sclerosis in the region of the left sacroiliac joint and left superior pubic ramus, suspicious for sclerotic metastatic disease. No fracture or dislocation.   Electronically Signed   By: Enrique Sack M.D.   On: 09/02/2014 13:23    Assessment/Plan Principal Problem:   Lower extremity weakness- ? from bone pain. Active Problems:   Prostate cancer metastatic to bone   Essential hypertension   Failure to thrive in adult   Dementia- advanced   DNR (do not resuscitate)   1. Bilateral lower extremity weakness: Seems more related to bony pain from metastatic disease, rather than true weakness. Evaluation  limited due to lack of cooperation. No obvious focal deficits. Pain control, PT and OT evaluation. Patient may need higher level of care. 2. Prostate cancer with skeletal metastasis: Continue Xtandi. OP Urology follow up. Will discuss with urologist on call in a.m. 3. Advanced dementia: Mental status at baseline. 4. Essential hypertension: Reasonable control. Continue hydralazine. Hold off amlodipine which she has not taken for a few days. 5. Chronic diastolic CHF: Continue Lasix. 6. Failure to thrive: Related to advanced dementia and metastatic prostate cancer. 7. History of hepatitis C      Code Status: DO NOT RESUSCITATE-confirmed with the patient's daughter.  Family Communication: Discussed with patient's daughter Ms. Joseph Robbins,  at bedside Disposition Plan: To be determined   Time spent: 71 minutes  Kristopher Delk, MD, FACP, FHM. [facility] Pager [phone]  If 7PM-7AM, please contact night-coverage [email or web address] Password [email or web address] 09/02/2014, 3:01 PM

## 2014-09-02 NOTE — ED Notes (Signed)
Per EMS pt coming from [facility] with c/o bilateral leg pain x 3 days, per nursing staff pt was ambulating independently prior to this, they report he appears to feel weak now. Pain from hips to knees, pt has hx hx of prostate and pancreatic cancer.

## 2014-09-03 DIAGNOSIS — C61 Malignant neoplasm of prostate: Secondary | ICD-10-CM | POA: Diagnosis not present

## 2014-09-03 NOTE — Progress Notes (Signed)
PROGRESS NOTE    Joseph Robbins MBW:[id number] DOB: 11/03/1946 DOA: 09/02/2014 PCP: Benito Mccreedy, MD Primary Urologist: Dr. Kathie Rhodes  HPI/Brief narrative 68 y.o. male, resident of ALF, with history of advanced dementia, prostate cancer with skeletal metastasis, chronic diastolic CHF, hepatitis C, hypertension, prior polysubstance abuse (alcohol, tobacco and crack cocaine) sent from ALF to the ED with complaints of bilateral lower extremity weakness/pain and inability to stand/walk. In the ED, x-rays of lumbar spine and pelvis showed sclerotic metastatic disease without fractures.  Assessment/Plan:  1. Bilateral lower extremity weakness/paraparesis: Objective evaluation difficult secondary to lack of patient cooperation. Not sure if his difficulty ambulation is due to bony pain versus true weakness. Ambulated 6 feet with PT with assistance. PT recommends SNF. Will discuss with daughter regarding aggressiveness of evaluation versus palliative care consultation. May consider MRI of thoracic and lumbar spine- to R/O cord compression, if aggressive care requested. 2. Prostate cancer with skeletal metastasis: Continue Xtandi. Will discuss with urology. 3. Advanced dementia: Mental status at baseline. 4. Essential hypertension: Controlled. Continue hydralazine. Amlodipine had been held OP for a few days-not restarted. 5. Chronic diastolic CHF: Continue Lasix. 6. Failure to thrive: Related to advanced dementia and metastatic prostate cancer. 7. History of hepatitis C  Code Status: DO NOT RESUSCITATE Family Communication: Left message for patient's daughter on 9/20. Disposition Plan: To be determined-? SNF   Consultants:  None  Procedures:  None  Antibiotics:  None   Subjective: Pleasantly confused. Does not follow instructions.  Objective: Filed Vitals:   09/02/14 1625 09/02/14 2123 09/03/14 0527 09/03/14 0949  BP: 150/77 106/54 135/64   Pulse:  55 44   Temp:  97.9 F  (36.6 C) 98.1 F (36.7 C)   TempSrc:  Oral Oral   Resp:  16 14   Height:    6' (1.829 m)  Weight:   64.91 kg (143 lb 1.6 oz)   SpO2:  100% 100%     Intake/Output Summary (Last 24 hours) at 09/03/14 1303 Last data filed at 09/03/14 0528  Gross per 24 hour  Intake    920 ml  Output    450 ml  Net    470 ml   Filed Weights   09/02/14 1515 09/03/14 0527  Weight: 64.9 kg (143 lb 1.3 oz) 64.91 kg (143 lb 1.6 oz)     Exam:  General exam: Pleasant elderly male sitting up in bed this morning attempting to eat breakfast. Respiratory system: Clear. No increased work of breathing. Cardiovascular system: S1 & S2 heard, RRR. No JVD, murmurs, gallops, clicks or pedal edema. Gastrointestinal system: Abdomen is nondistended, soft and nontender. Normal bowel sounds heard. Central nervous system: Alert and not oriented. No cranial deficits.  Extremities: Upper extremity with seemingly normal power. Lower extremities-at least 2 x 5 power,? LLE weaker than RLE.    Data Reviewed: Basic Metabolic Panel:  Recent Labs Lab 09/02/14 1210  NA 139  K 4.4  CL 103  CO2 23  GLUCOSE 84  BUN 27*  CREATININE 1.19  CALCIUM 9.2   Liver Function Tests:  Recent Labs Lab 09/02/14 1210  AST 61*  ALT 30  ALKPHOS 121*  BILITOT 0.9  PROT 8.1  ALBUMIN 2.9*   No results found for this basename: LIPASE, AMYLASE,  in the last 168 hours No results found for this basename: AMMONIA,  in the last 168 hours CBC:  Recent Labs Lab 09/02/14 1210  WBC 3.6*  NEUTROABS 1.8  HGB 11.1*  HCT 34.8*  MCV 89.9  PLT 206   Cardiac Enzymes: No results found for this basename: CKTOTAL, CKMB, CKMBINDEX, TROPONINI,  in the last 168 hours BNP (last 3 results)  Recent Labs  04/13/14 0710  PROBNP 1697.0*   CBG: No results found for this basename: GLUCAP,  in the last 168 hours  Recent Results (from the past 240 hour(s))  MRSA PCR SCREENING     Status: None   Collection Time    09/02/14  4:17 PM       Result Value Ref Range Status   MRSA by PCR NEGATIVE  NEGATIVE Final   Comment:            The GeneXpert MRSA Assay (FDA     approved for NASAL specimens     only), is one component of a     comprehensive MRSA colonization     surveillance program. It is not     intended to diagnose MRSA     infection nor to guide or     monitor treatment for     MRSA infections.       Studies: Dg Lumbar Spine Complete  09/02/2014   CLINICAL DATA:  Low back and hip pain. History of pancreatic cancer.  EXAM: LUMBAR SPINE - COMPLETE 4+ VIEW  COMPARISON:  Pelvis CT dated 03/20/2014 and abdomen and pelvis CT dated 04/30/2012.  FINDINGS: Five non-rib-bearing lumbar vertebrae. Interval diffuse sclerosis in the L1 vertebral body and patchy sclerosis in the L3 vertebral body. There is also some sclerosis in the superior aspect of the S1 vertebral body and in the region of the left sacroiliac joint. No fractures, pars defect or subluxations. Atheromatous arterial calcifications.  IMPRESSION: Interval areas of sclerosis in the spine and pelvis, as described above, most compatible with sclerotic metastatic disease.   Electronically Signed   By: Enrique Sack M.D.   On: 09/02/2014 13:20   Dg Pelvis 1-2 Views  09/02/2014   CLINICAL DATA:  Low back and pelvis pain. History of pancreatic cancer. A history of trauma was also given.  EXAM: PELVIS - 1-2 VIEW  COMPARISON:  Pelvis CT dated 03/20/2014 and lumbar spine radiographs obtained today.  FINDINGS: Oval, rim like calcific density in the right pelvis, corresponding to a penile prosthesis reservoir on the previous CT. The penile prosthesis is also noted. Interval mild patchy sclerosis in the region of the left sacroiliac joint. There is also some ill-defined sclerosis in the left superior pubic ramus. No fracture or dislocation seen.  IMPRESSION: Areas of subtle sclerosis in the region of the left sacroiliac joint and left superior pubic ramus, suspicious for sclerotic  metastatic disease. No fracture or dislocation.   Electronically Signed   By: Enrique Sack M.D.   On: 09/02/2014 13:23        Scheduled Meds: . aspirin EC  81 mg Oral Daily  . enoxaparin (LOVENOX) injection  40 mg Subcutaneous Q24H  . enzalutamide  80 mg Oral Q12H  . folic acid  1 mg Oral Daily  . furosemide  20 mg Oral Daily  . gabapentin  300 mg Oral BID  . hydrALAZINE  25 mg Oral TID  . HYDROcodone-acetaminophen  1 tablet Oral BID  . pantoprazole  40 mg Oral Daily  . potassium chloride  10 mEq Oral Daily  . thiamine  100 mg Oral Daily   Continuous Infusions:   Principal Problem:   Lower extremity weakness- ? from bone pain. Active Problems:   Prostate cancer metastatic to  bone   Essential hypertension   Failure to thrive in adult   Dementia- advanced   DNR (do not resuscitate)    Time spent: 72 minutes    Josselin Gaulin, MD, FACP, FHM. [facility] Pager [phone]  If 7PM-7AM, please contact night-coverage [email or web address] Password [email or web address] 09/03/2014, 1:03 PM    LOS: 1 day

## 2014-09-03 NOTE — Progress Notes (Signed)
OT Cancellation Note  Patient Details Name: Joseph Robbins MRN: [id number] DOB: 1946/07/10   Cancelled Treatment:    Reason Eval/Treat Not Completed: Other (comment) Pt is Medicare from SNF with current D/C plan back to SNF. No apparent immediate acute care OT needs, therefore will defer OT to SNF. If OT eval is needed please call Acute Rehab Dept. at [phone] or text page OT at [phone]. Marland Kitchen  [place], [place] [phone] 09/03/2014, 12:15 PM

## 2014-09-03 NOTE — Progress Notes (Signed)
UR completed 

## 2014-09-03 NOTE — Progress Notes (Signed)
Addendum  Discussed with daughter who states that she would not like any extensive or further evaluation of lower extremity weakness i.e. MRI or scans. She states that even if we find something i.e. mass compressing spinal cord, she wouldn't want him to go through surgery or radiation which would definitely affect his quality of life. She is clear in that she wishes for him to be comfortable with pain controlled. She understands that he may never be able to ambulate. She would like him to return back to the ALF with wheelchair.  Vernell Leep, MD, FACP, FHM. [facility] Pager [phone]  If 7PM-7AM, please contact night-coverage [email or web address] Password [facility] 09/03/2014, 6:09 PM

## 2014-09-04 DIAGNOSIS — E440 Moderate protein-calorie malnutrition: Secondary | ICD-10-CM | POA: Insufficient documentation

## 2014-09-04 DIAGNOSIS — C61 Malignant neoplasm of prostate: Secondary | ICD-10-CM | POA: Diagnosis not present

## 2014-09-04 DIAGNOSIS — F0390 Unspecified dementia without behavioral disturbance: Secondary | ICD-10-CM

## 2014-09-04 MED ORDER — HYDROCODONE-ACETAMINOPHEN 5-325 MG PO TABS: 1.0000 | ORAL_TABLET | Freq: Two times a day (BID) | ORAL | Status: AC

## 2014-09-04 NOTE — Progress Notes (Signed)
Pt for discharge to [facility].  CSW facilitated pt discharge needs including contacting facility, faxing pt discharge information to ALF, confirming facility received and reviewed information, discussing with pt daughter, Butch Penny via telephone, providing RN phone number to call report, and arranging ambulance transport via [place] for return to [facility].   No further social work needs identified at this time.  CSW signing off.   Alison Murray, MSW, [place] Work [phone]

## 2014-09-04 NOTE — Progress Notes (Signed)
INITIAL NUTRITION ASSESSMENT  DOCUMENTATION CODES Per approved criteria  -Non-severe (moderate) malnutrition in the context of chronic illness  Pt meets criteria for moderate MALNUTRITION in the context of chronic illness as evidenced by 12% body weight loss in < 6 months, moderate muscle wasting and subcutaneous fat loss.   INTERVENTION: -Recommend MagicCup BID w/meals -Recommend modifying to Nectar thick liquid and crushed PO meds -Encouraged continued feeding assistance -RD to continue to monitor  NUTRITION DIAGNOSIS: Difficulty swallowing related to dementia as evidenced by coughing/signs of thin liquids intolerance.   Goal: Pt to meet >/= 90% of their estimated nutrition needs    Monitor:  Pt to meet >/= 90% of their estimated nutrition needs    Reason for Assessment: MST  68 y.o. male  Admitting Dx: Lower extremity weakness  ASSESSMENT: Joseph Robbins is a 68 y.o. male, resident of ALF, with history of advanced dementia, prostate cancer with skeletal metastasis, chronic diastolic CHF, hepatitis C, hypertension, prior polysubstance abuse (alcohol, tobacco and crack cocaine) presented to the ED with complaints of bilateral lower extremity weakness/pain  -Per H&P, pt with adequate appetite pta. Attempted to contact ALF; however was unable to obtain a response -Per previous medical records, pt has possibly experienced a 20 lb unintentional wt loss in past 5 months (12% body weight loss, significant for time frame) -Was last seen by RD during admit in 03/2014. Noted pt to be emaciated. With visible muscle depletion and subcutaneous fat loss during RD assessment. NFPE not performed as pt was being fed breakfast -Pt receiving feeding assistance from Student Nurse, reported pt with lethargic at first, however was able to tolerate > 50% PO intake once more alert. Student Nurse noted pt with difficulty thin liquids, and RN reported pt coughing with meds. RN recommended PO meds be  crushed.  -Consider modifying to Nectar thick to assist in swallow tolerance   Height: Ht Readings from Last 1 Encounters:  09/03/14 6' (1.829 m)    Weight: Wt Readings from Last 1 Encounters:  09/03/14 143 lb 1.6 oz (64.91 kg)    Ideal Body Weight: 178 lb  % Ideal Body Weight: 80%  Wt Readings from Last 10 Encounters:  09/03/14 143 lb 1.6 oz (64.91 kg)  04/12/14 164 lb 11.2 oz (74.707 kg)  12/15/11 153 lb 7 oz (69.6 kg)    Usual Body Weight: unable to determine  % Usual Body Weight: unable to determine  BMI:  Body mass index is 19.4 kg/(m^2).  Estimated Nutritional Needs: Kcal: 1900-2100 Protein: 80-90 gram Fluid: >/= 1900 ml/daily  Skin: WDL  Diet Order: Cardiac  EDUCATION NEEDS: -No education needs identified at this time   Intake/Output Summary (Last 24 hours) at 09/04/14 1110 Last data filed at 09/04/14 1007  Gross per 24 hour  Intake    360 ml  Output   1400 ml  Net  -1040 ml    Last BM: 9/18   Labs:   Recent Labs Lab 09/02/14 1210  NA 139  K 4.4  CL 103  CO2 23  BUN 27*  CREATININE 1.19  CALCIUM 9.2  GLUCOSE 84    CBG (last 3)  No results found for this basename: GLUCAP,  in the last 72 hours  Scheduled Meds: . aspirin EC  81 mg Oral Daily  . enoxaparin (LOVENOX) injection  40 mg Subcutaneous Q24H  . enzalutamide  80 mg Oral Q12H  . folic acid  1 mg Oral Daily  . furosemide  20 mg Oral Daily  .  gabapentin  300 mg Oral BID  . hydrALAZINE  25 mg Oral TID  . HYDROcodone-acetaminophen  1 tablet Oral BID  . pantoprazole  40 mg Oral Daily  . potassium chloride  10 mEq Oral Daily  . thiamine  100 mg Oral Daily    Continuous Infusions:   Past Medical History  Diagnosis Date  . Hypertension   . Hepatitis C   . Dementia   . Pancreatic cancer     History reviewed. No pertinent past surgical history.  Atlee Abide MS RD LDN Clinical Dietitian [phone]

## 2014-09-04 NOTE — Progress Notes (Signed)
Physical Therapy Treatment Patient Details Name: Joseph Robbins MRN: [id number] DOB: May 25, 1946 Today's Date: 09/04/2014    History of Present Illness Pt admit with hx of pancreatic cancer, and from SNF (there since June 2015 after recent hospital admission). REports state he had been ambulating in hallways at [facility] with cane and RW, however unable to last few days and with BLE weakness and pain unpon standing and walking. x-ray of lumbar spine and pelvis shows sclerotic metastatic disease without fractures    PT Comments    Progressing slowly with mobility. Limited by cognition.   Follow Up Recommendations  SNF     Equipment Recommendations  None recommended by PT    Recommendations for Other Services       Precautions / Restrictions Precautions Precautions: Fall Restrictions Weight Bearing Restrictions: No    Mobility  Bed Mobility Overal bed mobility: Needs Assistance Bed Mobility: Supine to Sit;Sit to Supine     Supine to sit: Mod assist;+2 for physical assistance Sit to supine: Mod assist;+2 for physical assistance   General bed mobility comments: Assist for trunk and bil LEs. Utilized bedpad for scooting, postioning  Transfers Overall transfer level: Needs assistance Equipment used: 2 person hand held assist;Rolling walker (2 wheeled) Transfers: Sit to/from Stand Sit to Stand: Mod assist;+2 physical assistance         General transfer comment: assist to rise, stabilize, control descent.   Ambulation/Gait Ambulation/Gait assistance: Mod assist;+2 physical assistance Ambulation Distance (Feet): 50 Feet Assistive device: 2 person hand held assist Gait Pattern/deviations: Decreased stride length;Shuffle     General Gait Details: very unsteady. Requires external assist to facilitate forward progression.    Stairs            Wheelchair Mobility    Modified Rankin (Stroke Patients Only)       Balance Overall balance assessment: Needs assistance          Standing balance support: Bilateral upper extremity supported;During functional activity Standing balance-Leahy Scale: Poor                      Cognition Arousal/Alertness:  (awake when active; falls asleep easily)   Overall Cognitive Status: History of cognitive impairments - at baseline                      Exercises      General Comments        Pertinent Vitals/Pain Faces Pain Scale: No hurt    Home Living                      Prior Function            PT Goals (current goals can now be found in the care plan section) Progress towards PT goals: Progressing toward goals (slowly)    Frequency  Min 2X/week    PT Plan Current plan remains appropriate    Co-evaluation             End of Session Equipment Utilized During Treatment: Gait belt Activity Tolerance: Other (comment) (Limited by cognition) Patient left: in bed;with call bell/phone within reach     Time: 0071-2197 PT Time Calculation (min): 13 min  Charges:  $Gait Training: 8-22 mins                    G Codes:      Weston Anna, MPT Pager: [phone]

## 2014-09-04 NOTE — Progress Notes (Signed)
Clinical Social Work Department BRIEF PSYCHOSOCIAL ASSESSMENT 09/04/2014  Patient:  Joseph Robbins, Joseph Robbins     Account Number:  [id number]     [place] date:  09/02/2014  Clinical Social Worker:  Ulyess Blossom  Date/Time:  09/04/2014 11:00 AM  Referred by:  Physician  Date Referred:  09/04/2014 Referred for  ALF Placement   Other Referral:   Interview type:  Family Other interview type:    PSYCHOSOCIAL DATA Living Status:  FACILITY Admitted from facility:  [place] Level of care:  Assisted Living Primary support name:  Butch Penny Gladney/Daughter/[phone] Primary support relationship to patient:  CHILD, ADULT Degree of support available:   strong    CURRENT CONCERNS Current Concerns  Post-Acute Placement   Other Concerns:    SOCIAL WORK ASSESSMENT / PLAN CSW received referral that pt admitted from [facility] and observation only. Per chart, MD spoke extensively with pt daughter and pt daughter wishes for pt to return to ALF with a wheelchair if pt is unable to ambulate.    CSW reviewed chart and noted that pt oriented to person only. CSW contacted pt daughter, Butch Penny via telephone. Pt daughter confirmed that pt is a resident at [facility]. Pt daughter shared that pt has been at [facility] since June 2015 and pt and pt family have been satisfied with the care there. Pt daughter wishes for pt to return to [facility]F and has spoken to the facility and they are agreeable to pt returning. CSW shared with pt daughter that per MD, pt medically ready today.    CSW contacted [facility] and spoke with [place]. CSW discussed with ALF that pt needing more assist with ambulations and was 2 person hand held assist with PT. CSW shared that MD plans to order [facility] PT/OT and wheelchair for ALF. [facility] agreeable to accepting pt back and aware that pt medically ready for discharge today.    CSW completed FL2 and will facilitate pt discharge needs this afternoon.    Assessment/plan status:  Psychosocial Support/Ongoing Assessment of Needs Other assessment/ plan:   discharge planning   Information/referral to community resources:   Referral back to [facility]F; referral to [facility] for HH/equipment needs    PATIENT'S/FAMILY'S RESPONSE TO PLAN OF CARE: Pt oriented to person only. Pt daughter supportive and actively involved in pt care. Pt daughter eager for pt to return to [facility] for continued care at facility as pt family has been very satisfied with pt care.    Alison Murray, MSW, [place] Work [phone]

## 2014-09-04 NOTE — Care Management Note (Signed)
CARE MANAGEMENT NOTE 09/04/2014  Patient:  Joseph Robbins, Joseph Robbins   Account Number:  [id number]  Date Initiated:  09/04/2014  Documentation initiated by:  Leafy Kindle  Subjective/Objective Assessment:   68 yo admitted with lower extremity weakness.  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     Action/Plan:   From ALF [facility]   Anticipated DC Date:  09/04/2014   Anticipated DC Plan:  ASSISTED LIVING / [place]  CM consult      Choice offered to / List presented to:     DME arranged  [place]      DME agency  [place] arranged  [place].   Status of service:  Completed, signed off Medicare Important Message given?   (If response is "NO", the following Medicare IM given date fields will be blank) Date Medicare IM given:   Medicare IM given by:   Date Additional Medicare IM given:   Additional Medicare IM given by:    Discharge Disposition:    Per UR Regulation:  Reviewed for med. necessity/level of care/duration of stay  If discussed at [facility] of Stay Meetings, dates discussed:    Comments:  09/04/14 [place] Doctor RN,BSN,NCM [phone] Pt to DC back to [place].  Wheelchair and HHPT OT ordered.  Wheelchair to be delivered to [employer].  [facility] informed of orders for [facility] and DME.  No additional CM needs noted.

## 2014-09-04 NOTE — Progress Notes (Signed)
Report called to [facility] to nurse that will be receiving patient. Patient stable from AM assessment.

## 2014-09-04 NOTE — Progress Notes (Signed)
[facility] is providing the following services: Wheelchair  If patient discharges after hours, please call [phone].   Linward Headland 09/04/2014, 3:15 PM

## 2014-09-04 NOTE — Discharge Summary (Addendum)
Physician Discharge Summary  Joseph Robbins NWG:[id number] DOB: 07-17-1946 DOA: 09/02/2014  PCP: Benito Mccreedy, MD  Admit date: 09/02/2014 Discharge date: 09/04/2014  Time spent: Less than 30 minutes  Recommendations for Outpatient Follow-up:  1. M.D. at [facility] in 3 days.  Discharge Diagnoses:  Principal Problem:   Lower extremity weakness- ? from bone pain. Active Problems:   Prostate cancer metastatic to bone   Essential hypertension   Failure to thrive in adult   Dementia- advanced   DNR (do not resuscitate)   Malnutrition of moderate degree   Discharge Condition: Improved & Stable  Diet recommendation: Heart healthy diet.  Filed Weights   09/02/14 1515 09/03/14 0527  Weight: 64.9 kg (143 lb 1.3 oz) 64.91 kg (143 lb 1.6 oz)    History of present illness:  68 y.o. male, resident of ALF, with history of advanced dementia, prostate cancer with skeletal metastasis, chronic diastolic CHF, hepatitis C, hypertension, prior polysubstance abuse (alcohol, tobacco and crack cocaine) sent from ALF to the ED with complaints of bilateral lower extremity weakness/pain and inability to stand/walk. In the ED, x-rays of lumbar spine and pelvis showed sclerotic metastatic disease without fractures.   Hospital Course:   1. Bilateral lower extremity weakness/paraparesis: Objective evaluation difficult secondary to lack of patient cooperation. Not sure if his difficulty ambulation is due to bony pain versus true weakness. Ambulated 6 feet with PT with assistance. PT recommends SNF but daughter would like him to go back to prior ALF where he can be accomodated. Discussed with daughter who states that she would not like any extensive or further evaluation of lower extremity weakness i.e. MRI or scans. She states that even if we find something i.e. mass compressing spinal cord, she wouldn't want him to go through surgery or radiation which would definitely affect his quality of life. She is clear in  that she wishes for him to be comfortable with pain controlled. She understands that he may never be able to ambulate. She would like him to return back to the ALF with wheelchair. Patient has not needed significant amount of pain medications in the hospital. 2. Prostate cancer with skeletal metastasis: Continue Xtandi. Discussed with Dr. Karsten Ro who was agreeable with the above plan and will continue to follow patient in the clinic. 3. Advanced dementia: Mental status at baseline. 4. Essential hypertension: Controlled. Continue hydralazine and amlodipine. 5. Chronic diastolic CHF: Continue Lasix. Compensated. 6. Failure to thrive: Related to advanced dementia and metastatic prostate cancer. 7. History of hepatitis C 8. Chronic anemia: Stable 9. DO NOT RESUSCITATE   Pancreatic cancer has been listed in his history however not sure if he truly has this, or just Prostate Cancer.  Consultations:  None  Procedures:  None    Discharge Exam:  Complaints:  Pleasantly confused.  Filed Vitals:   09/03/14 0949 09/03/14 1305 09/03/14 2047 09/04/14 0608  BP:  138/72 175/76 119/54  Pulse:  51 54 65  Temp:   97.5 F (36.4 C) 98.6 F (37 C)  TempSrc:   Oral Axillary  Resp:  16 16 16   Height: 6' (1.829 m)     Weight:      SpO2:  99% 100% 95%   General exam: Pleasant elderly male lying comfortably in bed. Respiratory system: Clear. No increased work of breathing.  Cardiovascular system: S1 & S2 heard, RRR. No JVD, murmurs, gallops, clicks or pedal edema.  Gastrointestinal system: Abdomen is nondistended, soft and nontender. Normal bowel sounds heard.  Central nervous system: Alert  and not oriented. No cranial deficits.  Extremities: Upper extremity with seemingly normal power. Lower extremities-at least 2 x 5 power,? LLE weaker than RLE. Unable to objectively evaluate strength due to lack of cooperation from advanced dementia.   Discharge Instructions      Discharge Instructions    Call MD for:  severe uncontrolled pain    Complete by:  As directed      Diet - low sodium heart healthy    Complete by:  As directed      Increase activity slowly    Complete by:  As directed             Medication List    STOP taking these medications       bisacodyl 10 MG/30ML Enem  Commonly known as:  FLEET     neomycin-bacitracin-polymyxin ophthalmic ointment  Commonly known as:  NEOSPORIN      TAKE these medications       acetaminophen 325 MG tablet  Commonly known as:  TYLENOL  Take 650 mg by mouth every 6 (six) hours as needed for mild pain, fever or headache.     alum & mag hydroxide-simeth 200-200-20 MG/5ML suspension  Commonly known as:  MAALOX/MYLANTA  Take 30 mLs by mouth every 6 (six) hours as needed for indigestion or heartburn.     amLODipine 10 MG tablet  Commonly known as:  NORVASC  Take 10 mg by mouth daily.     aspirin EC 81 MG tablet  Take 1 tablet (81 mg total) by mouth daily.     cetirizine 10 MG tablet  Commonly known as:  ZYRTEC  Take 10 mg by mouth daily.     dimenhyDRINATE 50 MG tablet  Commonly known as:  DRAMAMINE  Take 50 mg by mouth every 4 (four) hours as needed for nausea or dizziness.     folic acid 1 MG tablet  Commonly known as:  FOLVITE  Take 1 tablet (1 mg total) by mouth daily.     furosemide 20 MG tablet  Commonly known as:  LASIX  Take 1 tablet (20 mg total) by mouth daily.     gabapentin 300 MG capsule  Commonly known as:  NEURONTIN  Take 300 mg by mouth 2 (two) times daily.     guaifenesin 100 MG/5ML syrup  Commonly known as:  ROBITUSSIN  Take 200 mg by mouth 3 (three) times daily as needed for cough.     hydrALAZINE 25 MG tablet  Commonly known as:  APRESOLINE  Take 25 mg by mouth 3 (three) times daily.     HYDROcodone-acetaminophen 5-325 MG per tablet  Commonly known as:  NORCO/VICODIN  Take 1 tablet by mouth 2 (two) times daily.     loperamide 2 MG capsule  Commonly known as:  IMODIUM  Take 2 mg by  mouth as needed for diarrhea or loose stools (maximum 3 in 12 hours).     magnesium hydroxide 400 MG/5ML suspension  Commonly known as:  MILK OF MAGNESIA  Take 30 mLs by mouth daily as needed for mild constipation.     omeprazole 20 MG capsule  Commonly known as:  PRILOSEC  Take 20 mg by mouth daily.     potassium chloride 10 MEQ tablet  Commonly known as:  K-DUR  Take 1 tablet (10 mEq total) by mouth daily.     PRESCRIPTION MEDICATION  Take 237 mLs by mouth 3 (three) times daily with meals. Great shakes  thiamine 100 MG tablet  Commonly known as:  VITAMIN B-1  Take 1 tablet (100 mg total) by mouth daily.     traZODone 50 MG tablet  Commonly known as:  DESYREL  Take 50 mg by mouth at bedtime as needed for sleep.     XTANDI 40 MG capsule  Generic drug:  enzalutamide  Take 80 mg by mouth every 12 (twelve) hours.       Follow-up Information   Follow up with MD at ALF. Schedule an appointment as soon as possible for a visit in 3 days.       The results of significant diagnostics from this hospitalization (including imaging, microbiology, ancillary and laboratory) are listed below for reference.    Significant Diagnostic Studies: Dg Lumbar Spine Complete  09/02/2014   CLINICAL DATA:  Low back and hip pain. History of pancreatic cancer.  EXAM: LUMBAR SPINE - COMPLETE 4+ VIEW  COMPARISON:  Pelvis CT dated 03/20/2014 and abdomen and pelvis CT dated 04/30/2012.  FINDINGS: Five non-rib-bearing lumbar vertebrae. Interval diffuse sclerosis in the L1 vertebral body and patchy sclerosis in the L3 vertebral body. There is also some sclerosis in the superior aspect of the S1 vertebral body and in the region of the left sacroiliac joint. No fractures, pars defect or subluxations. Atheromatous arterial calcifications.  IMPRESSION: Interval areas of sclerosis in the spine and pelvis, as described above, most compatible with sclerotic metastatic disease.   Electronically Signed   By: Enrique Sack M.D.   On: 09/02/2014 13:20   Dg Pelvis 1-2 Views  09/02/2014   CLINICAL DATA:  Low back and pelvis pain. History of pancreatic cancer. A history of trauma was also given.  EXAM: PELVIS - 1-2 VIEW  COMPARISON:  Pelvis CT dated 03/20/2014 and lumbar spine radiographs obtained today.  FINDINGS: Oval, rim like calcific density in the right pelvis, corresponding to a penile prosthesis reservoir on the previous CT. The penile prosthesis is also noted. Interval mild patchy sclerosis in the region of the left sacroiliac joint. There is also some ill-defined sclerosis in the left superior pubic ramus. No fracture or dislocation seen.  IMPRESSION: Areas of subtle sclerosis in the region of the left sacroiliac joint and left superior pubic ramus, suspicious for sclerotic metastatic disease. No fracture or dislocation.   Electronically Signed   By: Enrique Sack M.D.   On: 09/02/2014 13:23    Microbiology: Recent Results (from the past 240 hour(s))  MRSA PCR SCREENING     Status: None   Collection Time    09/02/14  4:17 PM      Result Value Ref Range Status   MRSA by PCR NEGATIVE  NEGATIVE Final   Comment:            The GeneXpert MRSA Assay (FDA     approved for NASAL specimens     only), is one component of a     comprehensive MRSA colonization     surveillance program. It is not     intended to diagnose MRSA     infection nor to guide or     monitor treatment for     MRSA infections.     Labs: Basic Metabolic Panel:  Recent Labs Lab 09/02/14 1210  NA 139  K 4.4  CL 103  CO2 23  GLUCOSE 84  BUN 27*  CREATININE 1.19  CALCIUM 9.2   Liver Function Tests:  Recent Labs Lab 09/02/14 1210  AST 61*  ALT 30  ALKPHOS 121*  BILITOT 0.9  PROT 8.1  ALBUMIN 2.9*   No results found for this basename: LIPASE, AMYLASE,  in the last 168 hours No results found for this basename: AMMONIA,  in the last 168 hours CBC:  Recent Labs Lab 09/02/14 1210  WBC 3.6*  NEUTROABS 1.8  HGB 11.1*   HCT 34.8*  MCV 89.9  PLT 206   Cardiac Enzymes: No results found for this basename: CKTOTAL, CKMB, CKMBINDEX, TROPONINI,  in the last 168 hours BNP: BNP (last 3 results)  Recent Labs  04/13/14 0710  PROBNP 1697.0*   CBG: No results found for this basename: GLUCAP,  in the last 168 hours    Signed:  Vernell Leep, MD, FACP, FHM. [facility] Pager [phone]  If 7PM-7AM, please contact night-coverage [email or web address] Password [email or web address] 09/04/2014, 1:08 PM

## 2014-09-14 NOTE — Progress Notes (Signed)
09/02/14 1808  PT G-Codes **NOT FOR INPATIENT CLASS**  Functional Assessment Tool Used clinical Judgement  Functional Limitation Mobility: Walking and moving around  Mobility: Walking and Moving Around Current Status (O2518) CL  Mobility: Walking and Moving Around Goal Status (F8421) CI

## 2014-09-14 DEATH — deceased

## 2016-01-23 IMAGING — CT CT HEAD W/O CM
1 series · 16 of 29 positions shown, 20 images · non-contrast
Comparison: CT of the head performed 05/14/2009

CLINICAL DATA: Altered mental status. Recent diagnosis of
pancreatic cancer.

EXAM:
CT HEAD WITHOUT CONTRAST
TECHNIQUE: Contiguous axial images were obtained from the base of the skull
through the vertex without intravenous contrast.

[Series 2: head 5.0 h30s · axial · 0.47mm/px · z∈[-125,+5]mm · 16 of 29 slices shown, 20 images]
[im 2/29  brain]
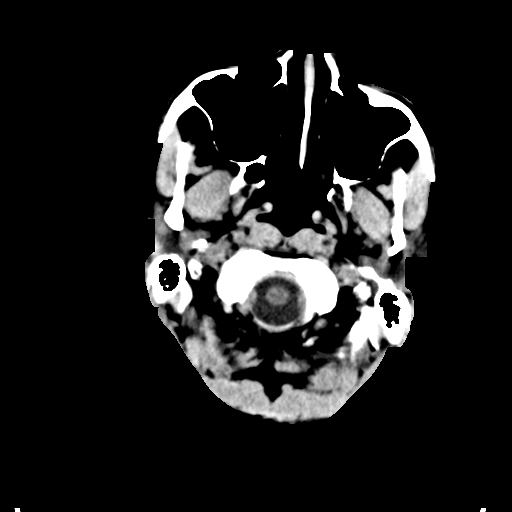
[im 2/29  bone]
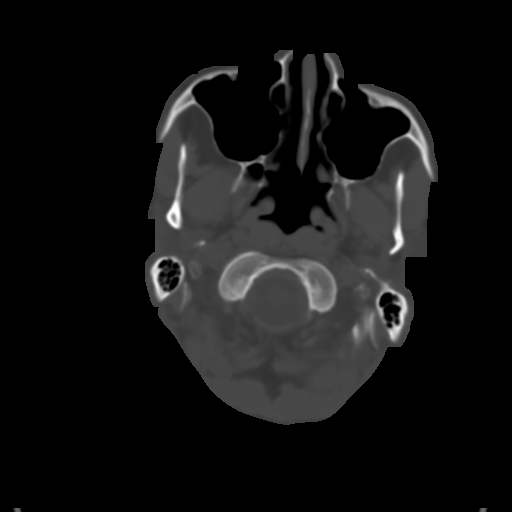
[im 4/29  brain]
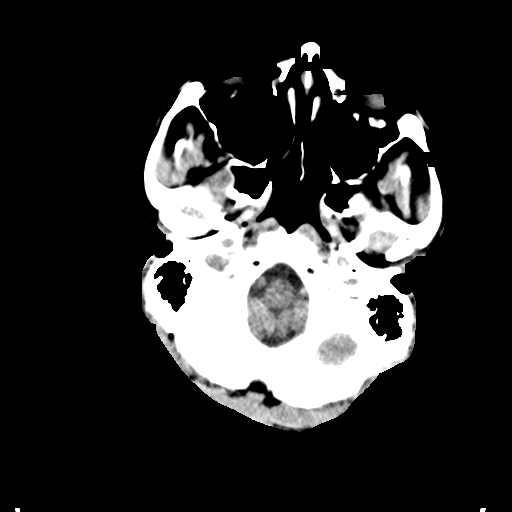
[im 6/29  brain]
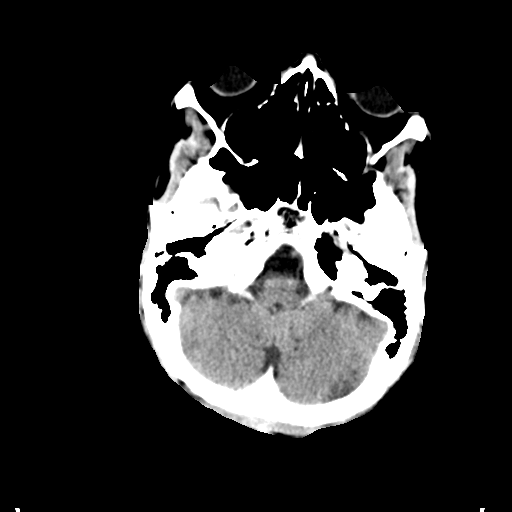
[im 7/29  brain]
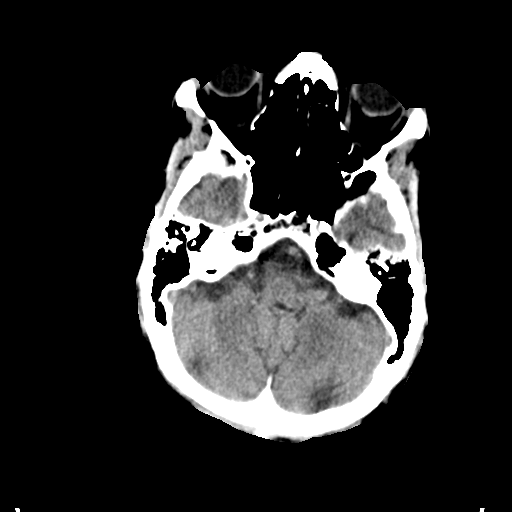
[im 9/29  brain]
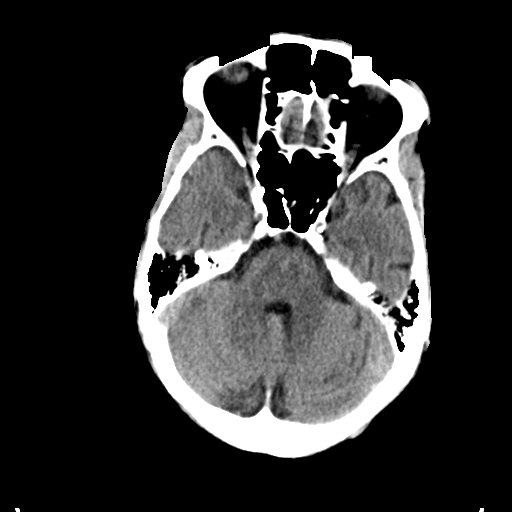
[im 9/29  bone]
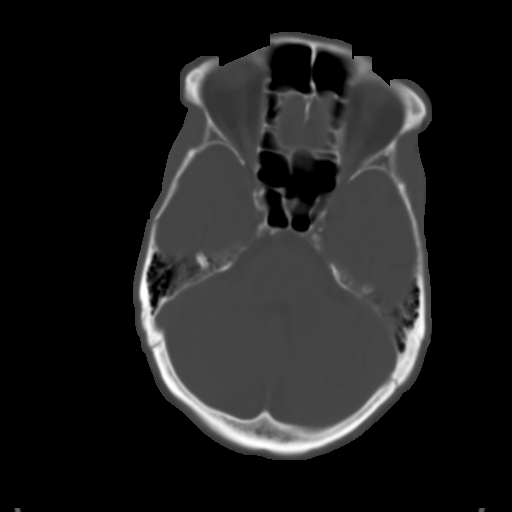
[im 11/29  brain]
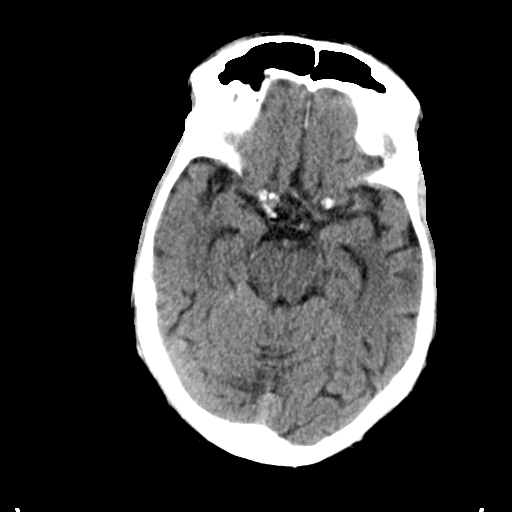
[im 12/29  brain]
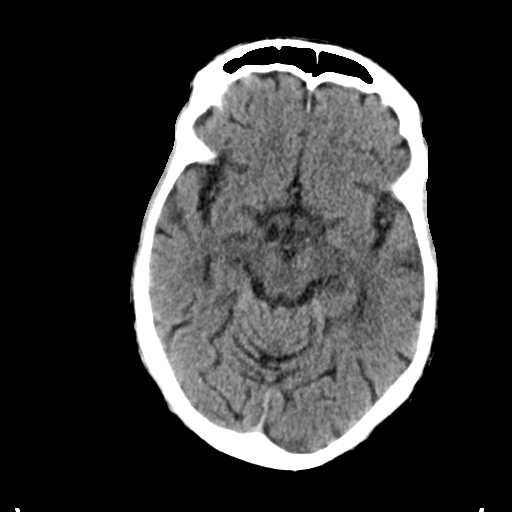
[im 14/29  brain]
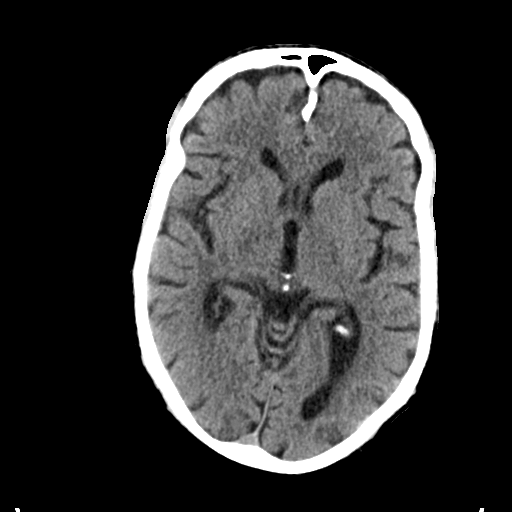
[im 16/29  brain]
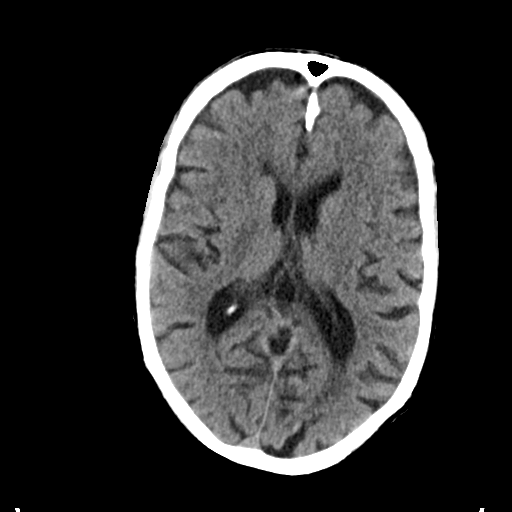
[im 16/29  bone]
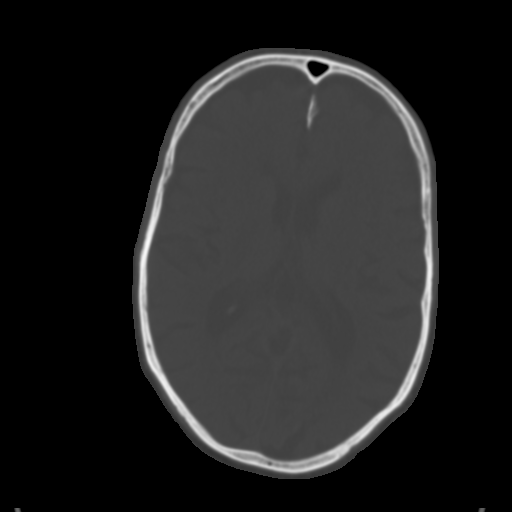
[im 18/29  brain]
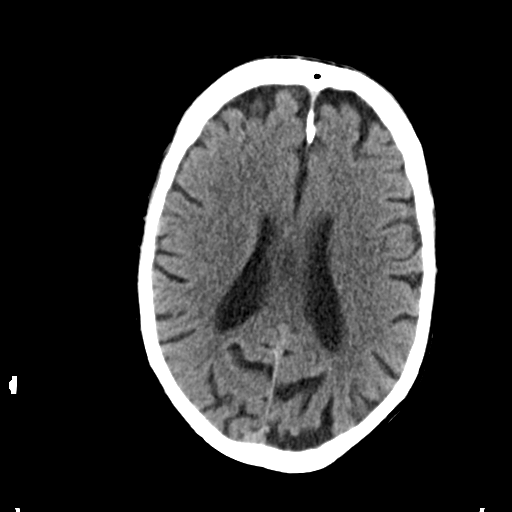
[im 19/29  brain]
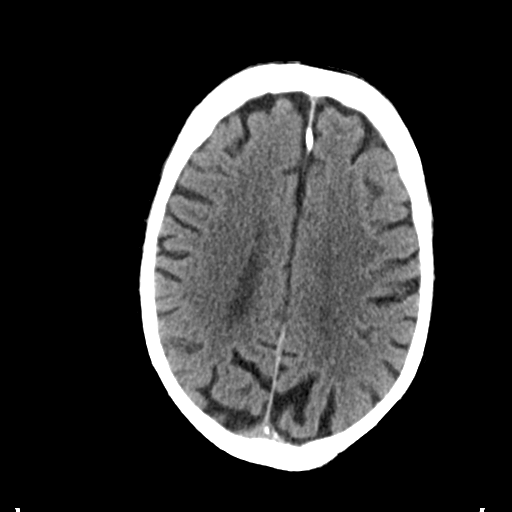
[im 21/29  brain]
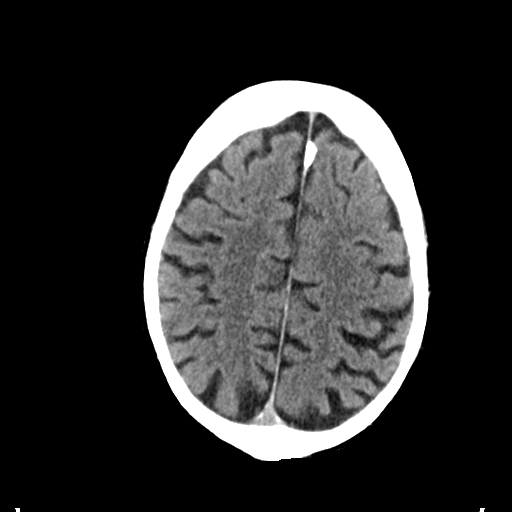
[im 23/29  brain]
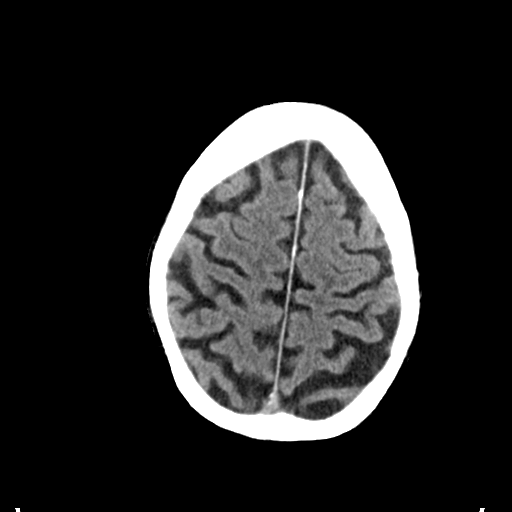
[im 23/29  bone]
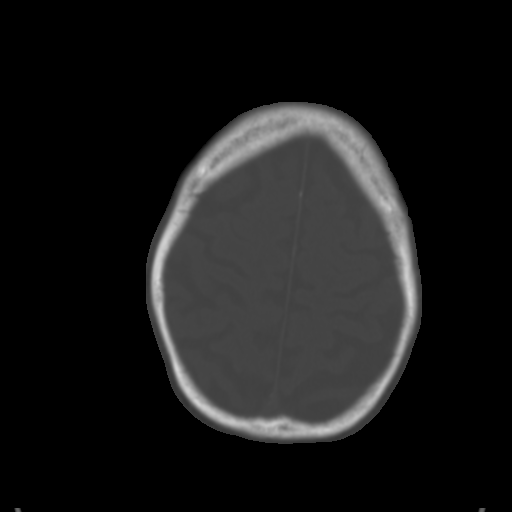
[im 24/29  brain]
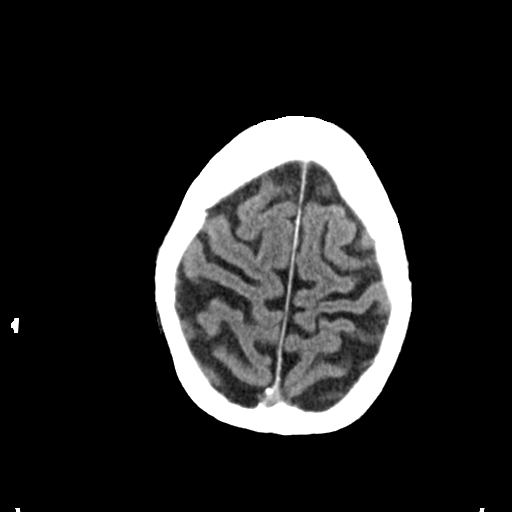
[im 26/29  brain]
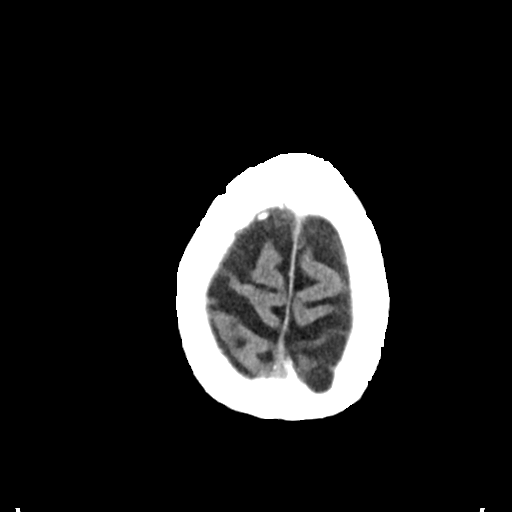
[im 28/29  brain]
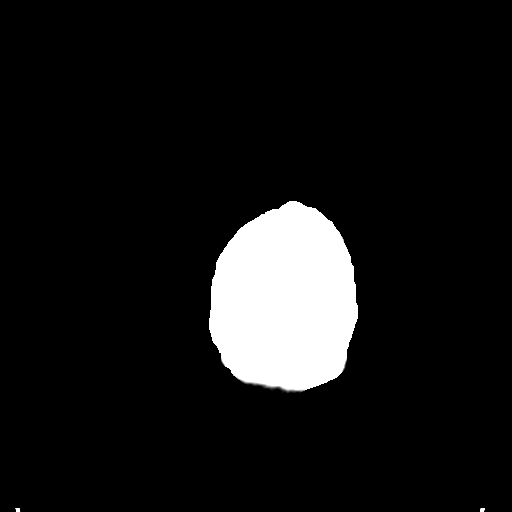

[16 of 29 positions shown; findings below may reference images not displayed]

FINDINGS: There is no evidence of acute infarction, mass lesion, or intra- or
extra-axial hemorrhage on CT.

Prominence of the ventricles and sulci reflects mild cortical volume
loss.

The brainstem and fourth ventricle are within normal limits. The
basal ganglia are unremarkable in appearance. The cerebral
hemispheres demonstrate grossly normal gray-white differentiation.
No mass effect or midline shift is seen.

There is no evidence of fracture; visualized osseous structures are
unremarkable in appearance. The orbits are within normal limits. The
paranasal sinuses and mastoid air cells are well-aerated. No
significant soft tissue abnormalities are seen.
IMPRESSION: 1. No acute intracranial pathology seen on CT.
2. Mild cortical volume loss noted.

## 2016-01-23 IMAGING — CR DG CHEST 2V
1 series · 1 of 1 positions shown · non-contrast
Comparison: December 11, 2011

CLINICAL DATA: Pain ; history of pancreatic carcinoma

EXAM:
CHEST  2 VIEW

[w chest lat]
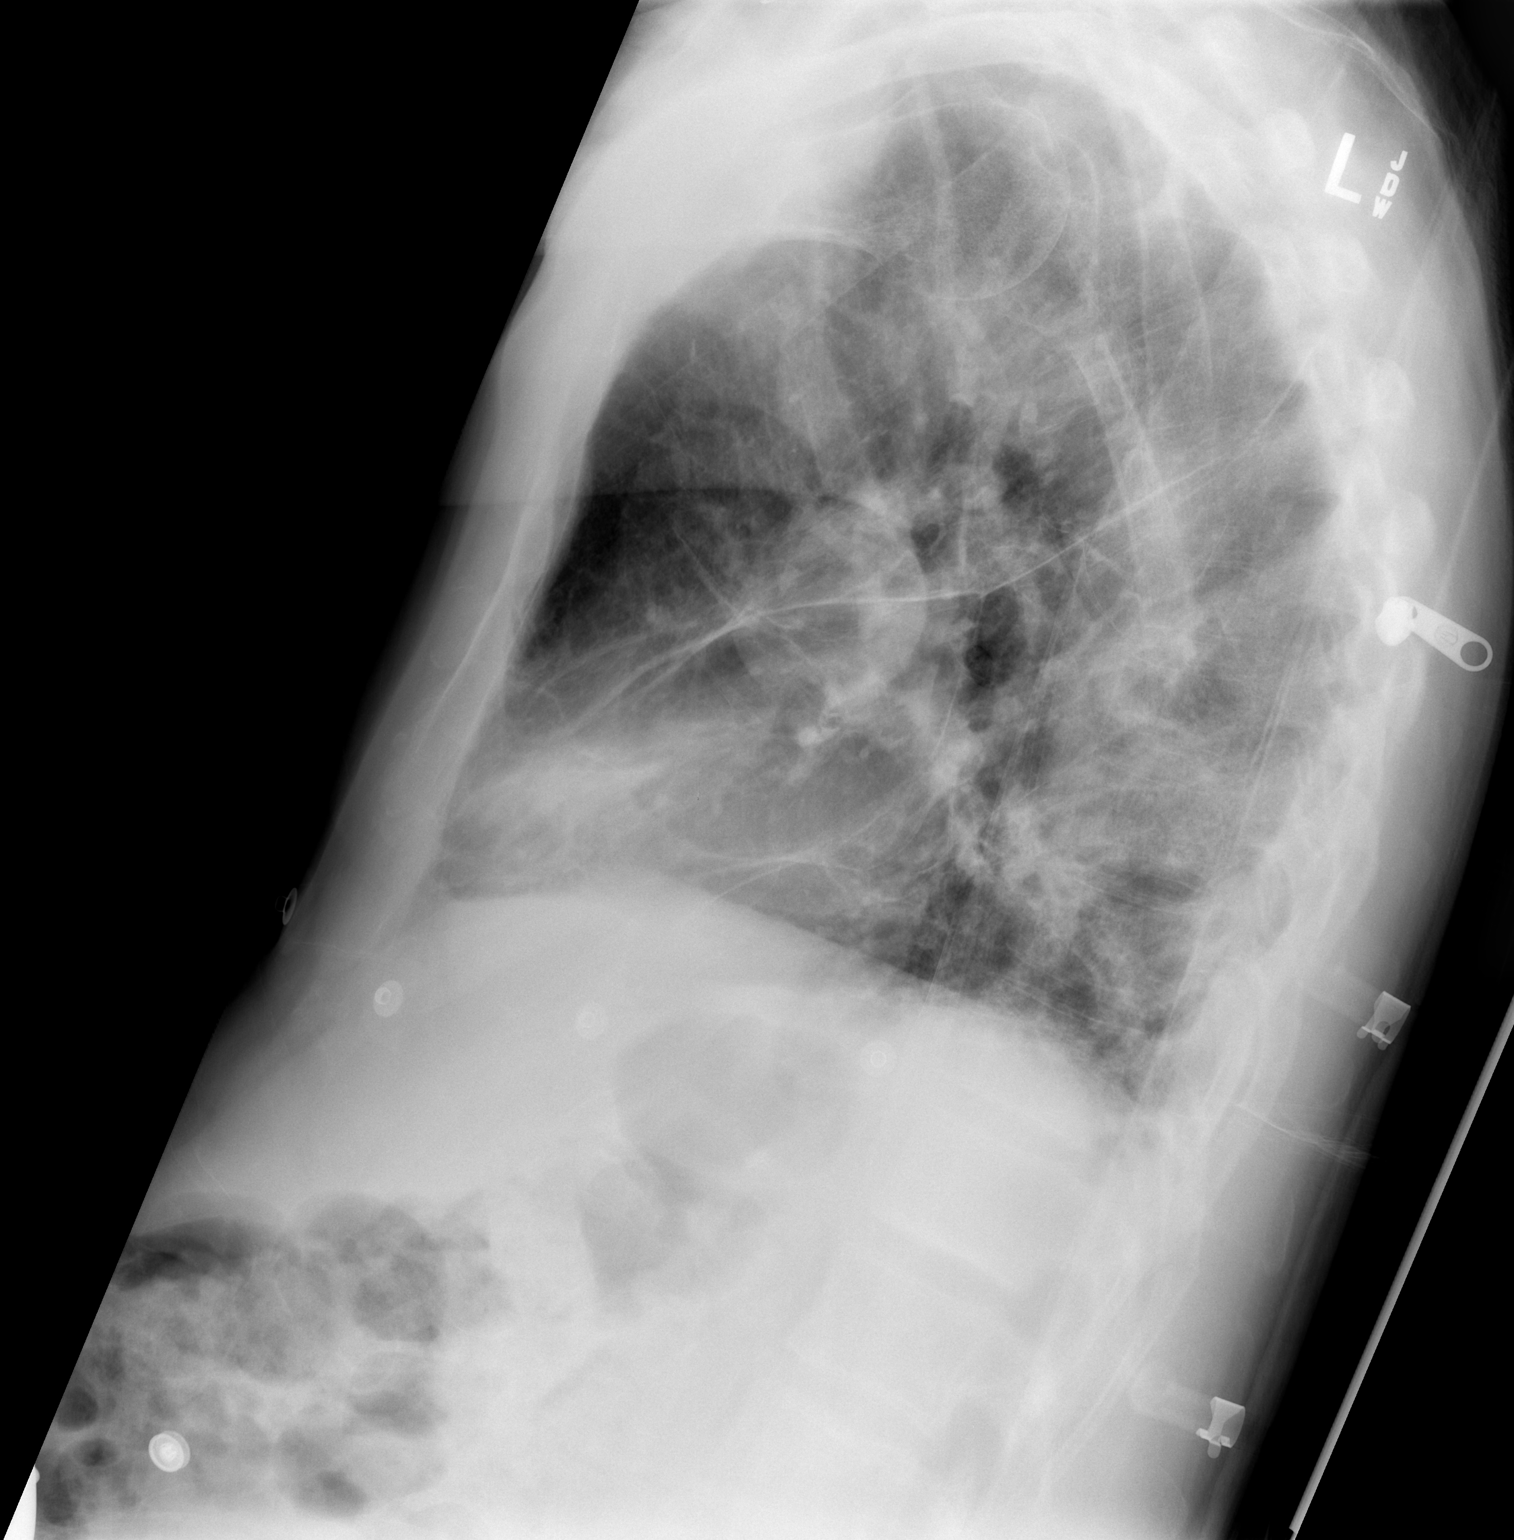

[1 of 1 positions shown; findings below may reference images not displayed]

FINDINGS: There is underlying emphysematous change. There is scarring
throughout both lungs, stable. In comparison with the prior study,
there is an area of increased opacity in the right middle lobe,
probably representing localized pneumonitis. Elsewhere, lungs appear
stable compared to prior study.

Heart size is within normal limits. Pulmonary vascularity reflects
underlying emphysema. No adenopathy. Bones appear osteoporotic.
IMPRESSION: Area of infiltrate in the right middle lobe. Superimposed lung
scarring. Underlying emphysematous change.

## 2016-01-24 IMAGING — US US RENAL
1 series · 14 of 21 positions shown · non-contrast
Comparison: CT of the abdomen and pelvis performed 04/30/2012

CLINICAL DATA: Acute renal insufficiency.

EXAM:
RENAL/URINARY TRACT ULTRASOUND COMPLETE

[Series 1: us renal · 0.31mm/px · 14 of 21 slices shown]
[im 1/21]
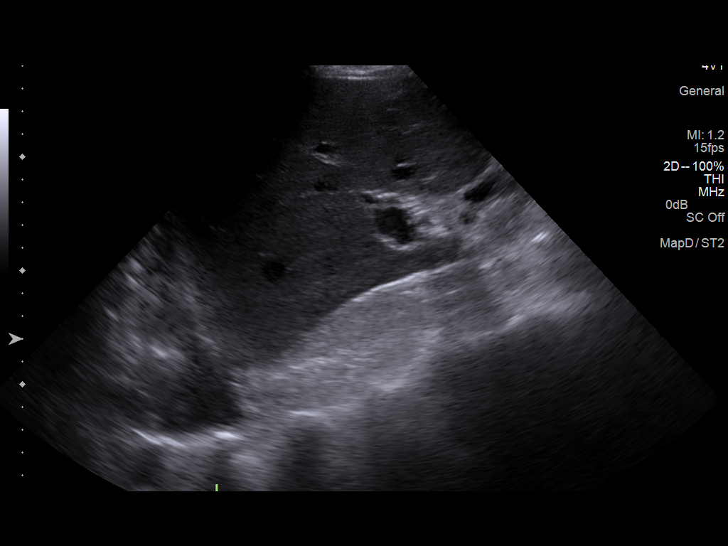
[im 3/21]
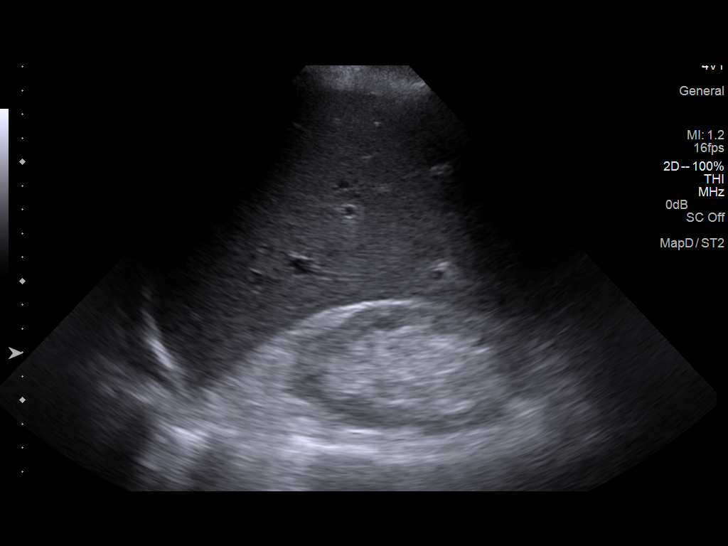
[im 4/21]
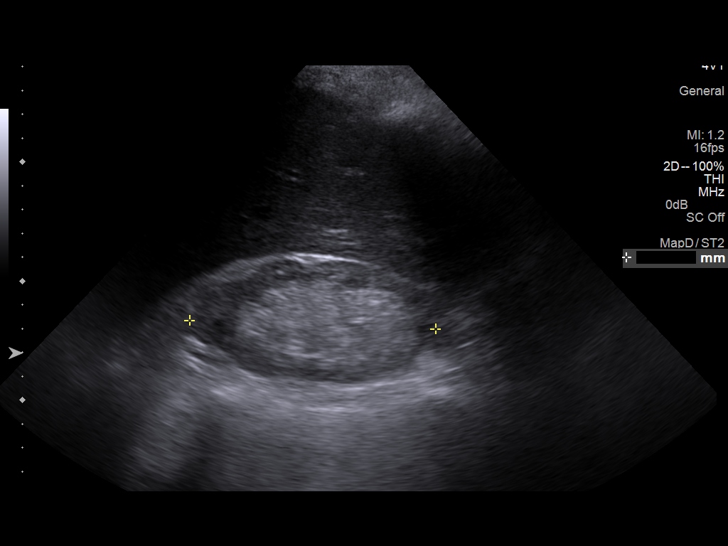
[im 6/21]
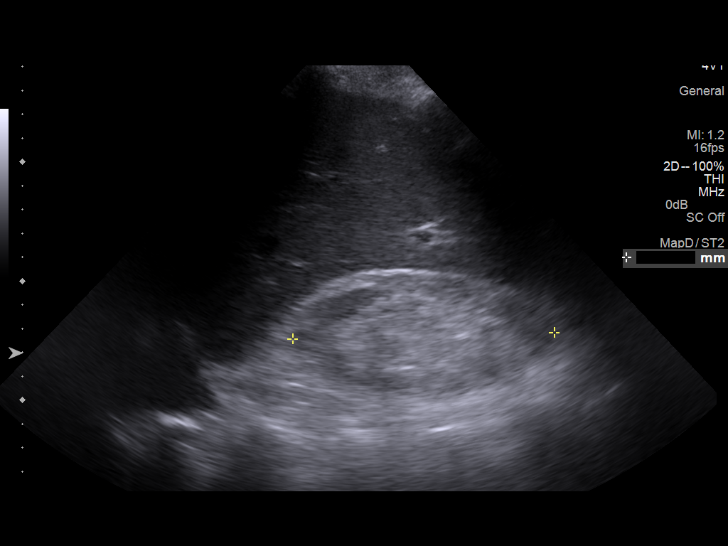
[im 7/21]
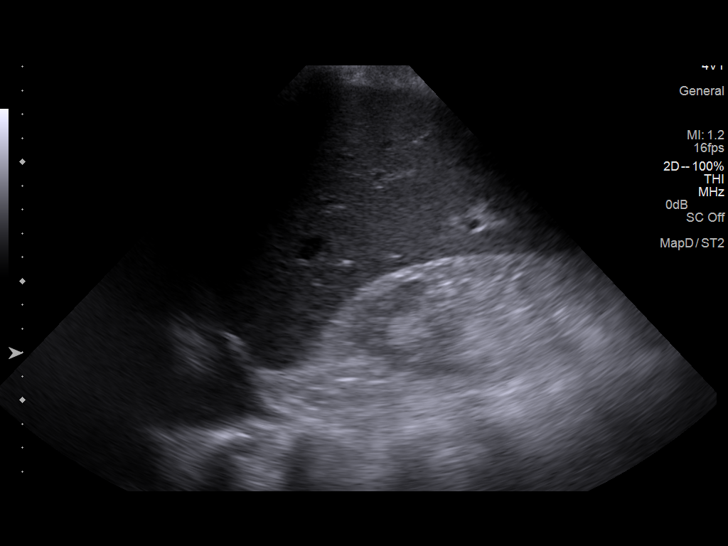
[im 9/21]
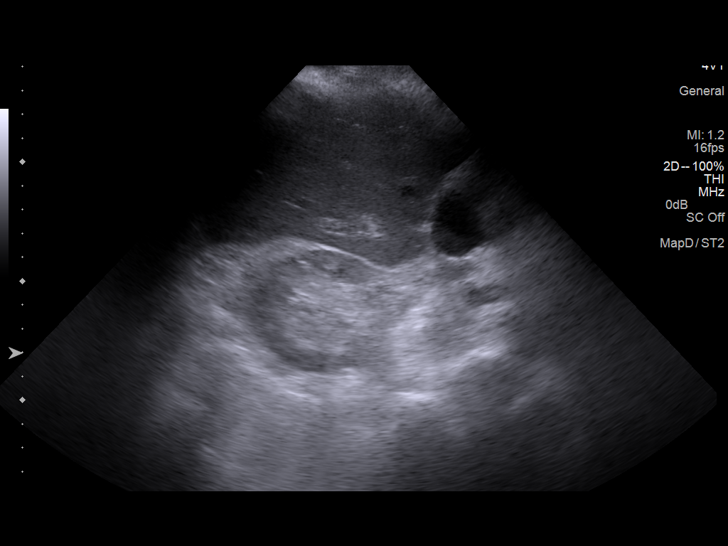
[im 10/21]
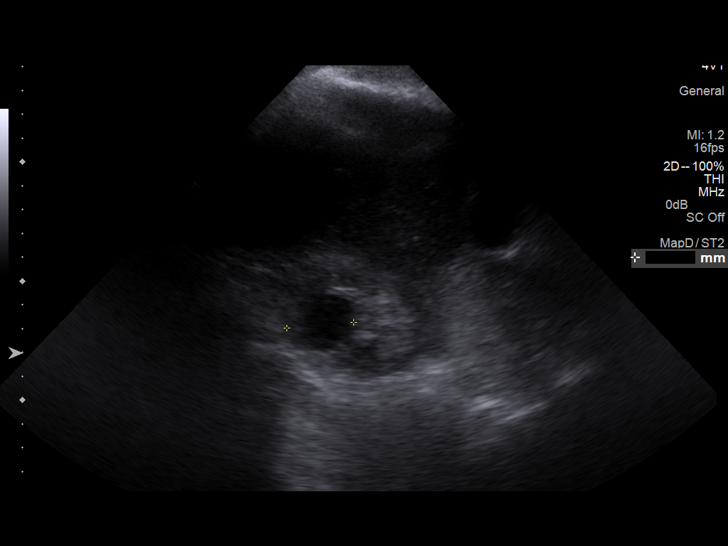
[im 12/21]
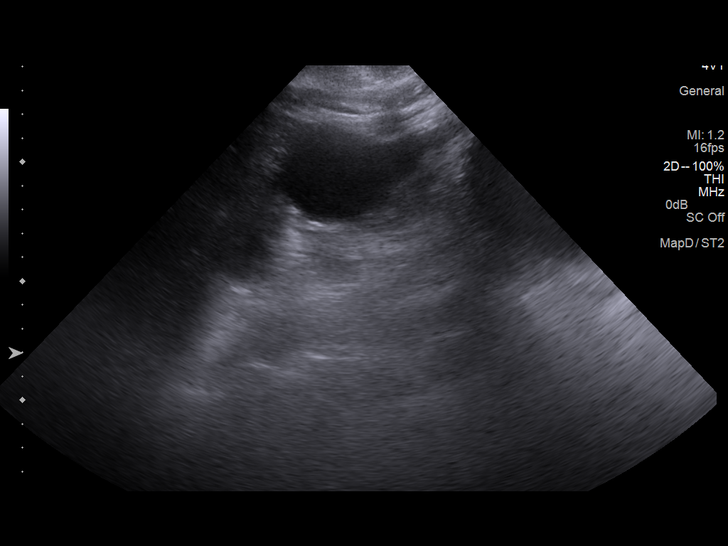
[im 13/21]
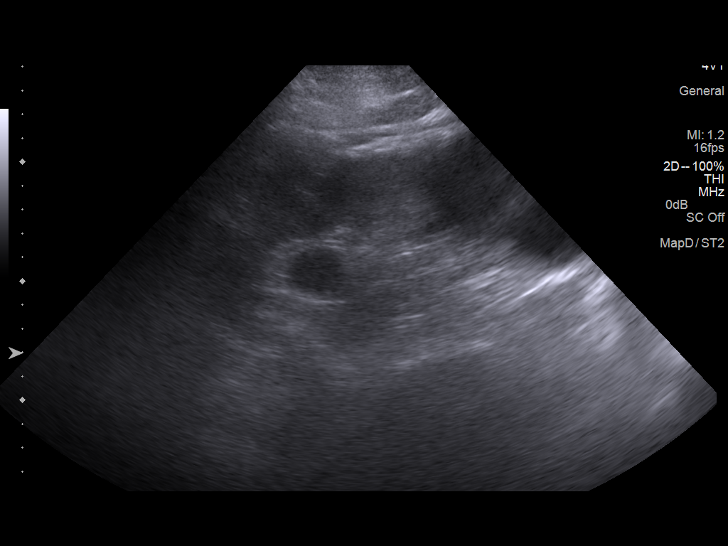
[im 15/21]
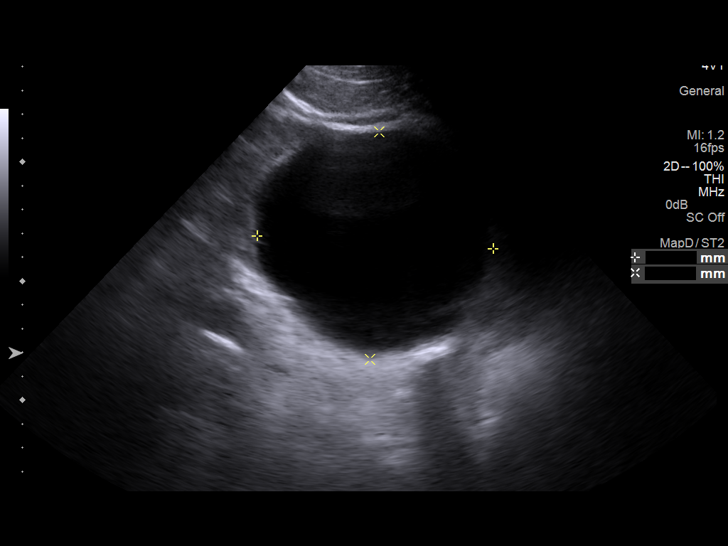
[im 16/21]
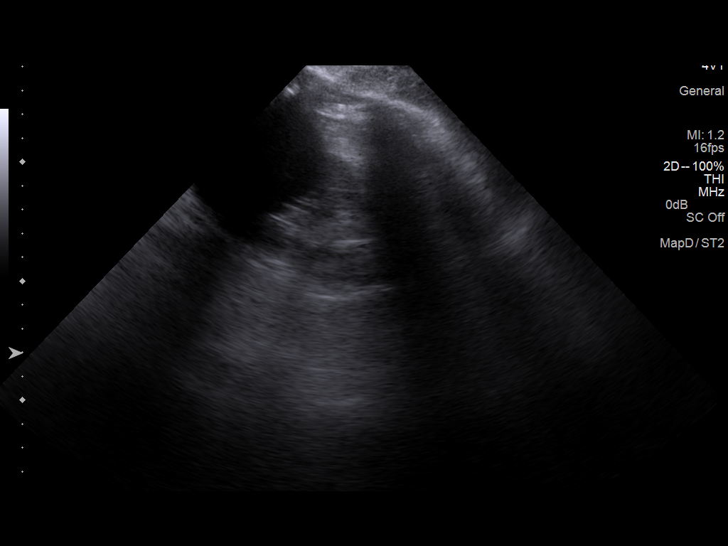
[im 18/21]
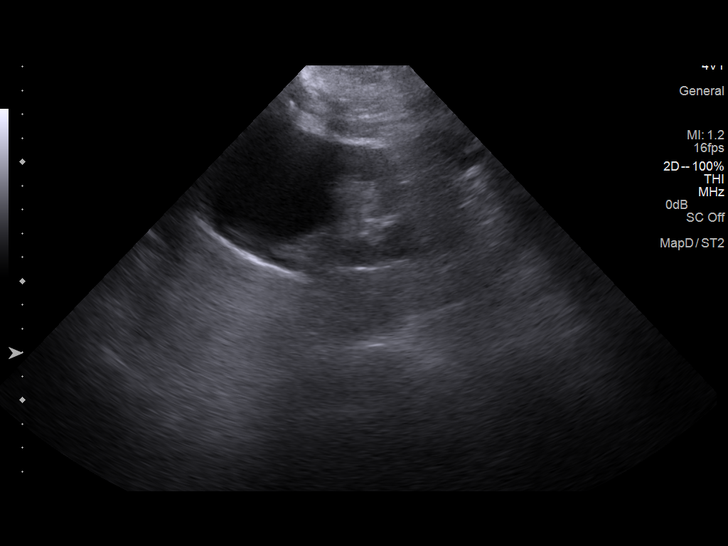
[im 19/21]
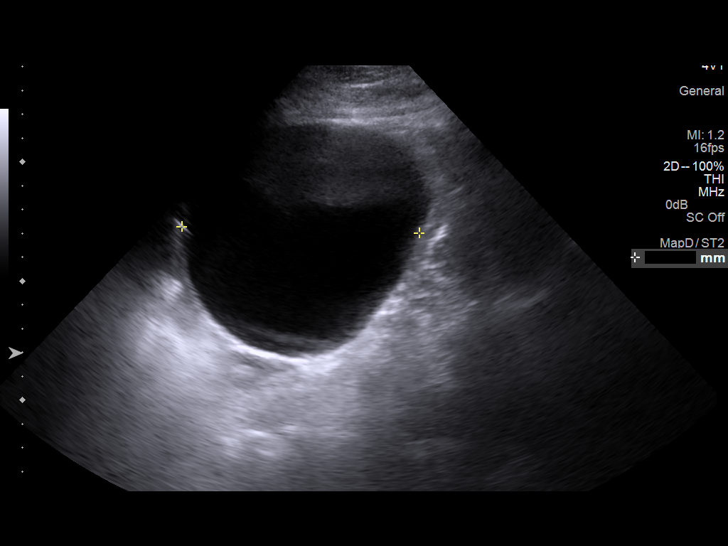
[im 21/21]
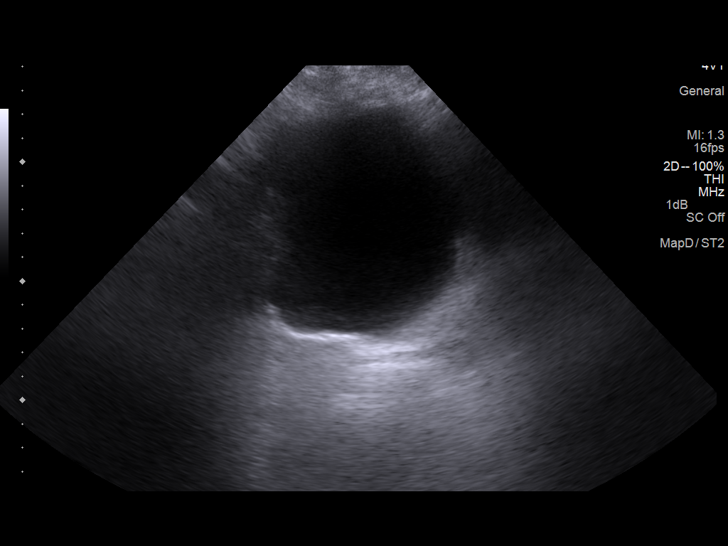

[14 of 21 positions shown; findings below may reference images not displayed]

FINDINGS: Right Kidney:

Length: 11.0 cm. Diffusely increased parenchymal echogenicity and
cortical thinning noted. A 2.8 x 2.5 x 2.4 cm cyst is noted at the
lower pole of the right kidney. No hydronephrosis visualized.

Left Kidney:

Length: 12.5 cm. Diffusely increased parenchymal echogenicity and
cortical thinning noted. Two cysts are noted at the left kidney,
measuring 6.3 x 5.7 x 4.5 cm at the interpole region of the left
kidney, and 10.0 x 9.9 x 9.6 cm at the lower pole of the left
kidney. No hydronephrosis visualized.

Bladder:

Appears normal for degree of bladder distention.

Note is made of a trace right pleural effusion.
IMPRESSION: 1. Diffusely increased renal parenchymal echogenicity raises concern
for medical renal disease.
2. Renal cortical thinning likely reflects underlying chronic renal
disease.
3. Bilateral renal cysts seen, measuring up to 10.0 cm at the left
kidney. These appear new from 9338.
